# Patient Record
Sex: Male | Born: 1965 | Race: White | Hispanic: No | Marital: Married | Smoking: Never smoker
Health system: Southern US, Community
[De-identification: ages and names within clinical notes are randomized; demographics above are authoritative.]

## PROBLEM LIST (undated history)

## (undated) DIAGNOSIS — T148XXA Other injury of unspecified body region, initial encounter: Secondary | ICD-10-CM

---

## 1990-10-12 DIAGNOSIS — T148XXA Other injury of unspecified body region, initial encounter: Secondary | ICD-10-CM

## 1990-10-12 HISTORY — DX: Other injury of unspecified body region, initial encounter: T14.8XXA

## 2007-02-27 ENCOUNTER — Other Ambulatory Visit: Payer: Self-pay

## 2007-02-27 ENCOUNTER — Emergency Department: Payer: Self-pay | Admitting: Emergency Medicine

## 2014-02-23 ENCOUNTER — Ambulatory Visit: Payer: Self-pay | Admitting: Emergency Medicine

## 2015-12-26 ENCOUNTER — Ambulatory Visit
Admission: EM | Admit: 2015-12-26 | Discharge: 2015-12-26 | Disposition: A | Discharge status: Home / Self Care | Payer: BLUE CROSS/BLUE SHIELD | Attending: Family Medicine | Admitting: Family Medicine

## 2015-12-26 DIAGNOSIS — J069 Acute upper respiratory infection, unspecified: Secondary | ICD-10-CM

## 2015-12-26 DIAGNOSIS — J0181 Other acute recurrent sinusitis: Secondary | ICD-10-CM

## 2015-12-26 HISTORY — DX: Other injury of unspecified body region, initial encounter: T14.8XXA

## 2015-12-26 LAB — RAPID INFLUENZA A&B ANTIGENS (ARMC ONLY): INFLUENZA A (ARMC): NEGATIVE

## 2015-12-26 LAB — RAPID STREP SCREEN (MED CTR MEBANE ONLY): Streptococcus, Group A Screen (Direct): NEGATIVE

## 2015-12-26 LAB — RAPID INFLUENZA A&B ANTIGENS: Influenza B (ARMC): NEGATIVE

## 2015-12-26 MED ORDER — AZITHROMYCIN 250 MG PO TABS: ORAL_TABLET | ORAL | Status: DC

## 2015-12-26 MED ORDER — FEXOFENADINE-PSEUDOEPHED ER 180-240 MG PO TB24: 1.0000 | ORAL_TABLET | Freq: Every day | ORAL | Status: DC

## 2015-12-26 MED ORDER — BENZONATATE 100 MG PO CAPS: 100.0000 mg | ORAL_CAPSULE | Freq: Three times a day (TID) | ORAL | Status: DC

## 2015-12-26 NOTE — Discharge Instructions (Signed)
Sinusitis, Adult °Sinusitis is redness, soreness, and puffiness (inflammation) of the air pockets in the bones of your face (sinuses). The redness, soreness, and puffiness can cause air and mucus to get trapped in your sinuses. This can allow germs to grow and cause an infection.  °HOME CARE  °· Drink enough fluids to keep your pee (urine) clear or pale yellow. °· Use a humidifier in your home. °· Run a hot shower to create steam in the bathroom. Sit in the bathroom with the door closed. Breathe in the steam 3-4 times a day. °· Put a warm, moist washcloth on your face 3-4 times a day, or as told by your doctor. °· Use salt water sprays (saline sprays) to wet the thick fluid in your nose. This can help the sinuses drain. °· Only take medicine as told by your doctor. °GET HELP RIGHT AWAY IF:  °· Your pain gets worse. °· You have very bad headaches. °· You are sick to your stomach (nauseous). °· You throw up (vomit). °· You are very sleepy (drowsy) all the time. °· Your face is puffy (swollen). °· Your vision changes. °· You have a stiff neck. °· You have trouble breathing. °MAKE SURE YOU:  °· Understand these instructions. °· Will watch your condition. °· Will get help right away if you are not doing well or get worse. °  °This information is not intended to replace advice given to you by your health care provider. Make sure you discuss any questions you have with your health care provider. °  °Document Released: 03/16/2008 Document Revised: 10/19/2014 Document Reviewed: 05/03/2012 °Elsevier Interactive Patient Education ©2016 Elsevier Inc. ° °Upper Respiratory Infection, Adult °Most upper respiratory infections (URIs) are caused by a virus. A URI affects the nose, throat, and upper air passages. The most common type of URI is often called "the common cold." °HOME CARE  °· Take medicines only as told by your doctor. °· Gargle warm saltwater or take cough drops to comfort your throat as told by your doctor. °· Use a  warm mist humidifier or inhale steam from a shower to increase air moisture. This may make it easier to breathe. °· Drink enough fluid to keep your pee (urine) clear or pale yellow. °· Eat soups and other clear broths. °· Have a healthy diet. °· Rest as needed. °· Go back to work when your fever is gone or your doctor says it is okay. °¨ You may need to stay home longer to avoid giving your URI to others. °¨ You can also wear a face mask and wash your hands often to prevent spread of the virus. °· Use your inhaler more if you have asthma. °· Do not use any tobacco products, including cigarettes, chewing tobacco, or electronic cigarettes. If you need help quitting, ask your doctor. °GET HELP IF: °· You are getting worse, not better. °· Your symptoms are not helped by medicine. °· You have chills. °· You are getting more short of breath. °· You have brown or red mucus. °· You have yellow or brown discharge from your nose. °· You have pain in your face, especially when you bend forward. °· You have a fever. °· You have puffy (swollen) neck glands. °· You have pain while swallowing. °· You have white areas in the back of your throat. °GET HELP RIGHT AWAY IF:  °· You have very bad or constant: °¨ Headache. °¨ Ear pain. °¨ Pain in your forehead, behind your eyes, and over   your cheekbones (sinus pain). °¨ Chest pain. °· You have long-lasting (chronic) lung disease and any of the following: °¨ Wheezing. °¨ Long-lasting cough. °¨ Coughing up blood. °¨ A change in your usual mucus. °· You have a stiff neck. °· You have changes in your: °¨ Vision. °¨ Hearing. °¨ Thinking. °¨ Mood. °MAKE SURE YOU:  °· Understand these instructions. °· Will watch your condition. °· Will get help right away if you are not doing well or get worse. °  °This information is not intended to replace advice given to you by your health care provider. Make sure you discuss any questions you have with your health care provider. °  °Document Released:  03/16/2008 Document Revised: 02/12/2015 Document Reviewed: 01/03/2014 °Elsevier Interactive Patient Education ©2016 Elsevier Inc. ° °

## 2015-12-26 NOTE — ED Provider Notes (Signed)
CSN: [id number]     Arrival date & time 12/26/15  1003 History   First MD Initiated Contact with Patient 12/26/15 1121    Nurses notes were reviewed. Chief Complaint  Patient presents with  . URI    Patient states that he first got sick Monday. Started getting worse Tuesday Wednesday. He reports this morning feels a lot worse and came in to be seen and evaluated. Does not smoke and no sick family medical problems that he is aware of. He has had fracture of his right hand and denies smoking.The family medical history pertinent to this visit.   (Consider location/radiation/quality/duration/timing/severity/associated sxs/prior Treatment) Patient is a 50 y.o. male presenting with URI. The history is provided by the patient. No language interpreter was used.  URI Presenting symptoms: congestion, cough, fatigue and rhinorrhea   Severity:  Moderate Onset quality:  Sudden Duration:  3 weeks Progression:  Worsening Chronicity:  New Relieved by:  Nothing Worsened by:  Nothing tried Associated symptoms: myalgias, sinus pain and sneezing     Past Medical History  Diagnosis Date  . Broken bones 1992    Right Hand   History reviewed. No pertinent past surgical history. History reviewed. No pertinent family history. Social History  Substance Use Topics  . Smoking status: Never Smoker   . Smokeless tobacco: Never Used  . Alcohol Use: Yes    Review of Systems  Constitutional: Positive for fatigue.  HENT: Positive for congestion, rhinorrhea and sneezing.   Respiratory: Positive for cough.   Musculoskeletal: Positive for myalgias.  All other systems reviewed and are negative.   Allergies  Review of patient's allergies indicates no known allergies.  Home Medications   Prior to Admission medications   Medication Sig Start Date End Date Taking? Authorizing Provider  clotrimazole (LOTRIMIN) 1 % cream Apply 1 application topically 2 (two) times daily.   Yes Historical Provider, MD   Phenyleph-CPM-DM-APAP (ALKA-SELTZER PLUS COLD & FLU PO) Take by mouth.   Yes Historical Provider, MD  TERBINAFINE HCL PO Take 250 mg by mouth daily.   Yes Historical Provider, MD  azithromycin (ZITHROMAX Z-PAK) 250 MG tablet Take 2 tablets first day and then 1 po a day for 4 days 12/26/15   Hassan Rowan, MD  benzonatate (TESSALON) 100 MG capsule Take 1 capsule (100 mg total) by mouth every 8 (eight) hours. 12/26/15   Hassan Rowan, MD  fexofenadine-pseudoephedrine (ALLEGRA-D ALLERGY & CONGESTION) 180-240 MG 24 hr tablet Take 1 tablet by mouth daily. 12/26/15   Hassan Rowan, MD   Meds Ordered and Administered this Visit  Medications - No data to display  BP 142/91 mmHg  Pulse 74  Temp(Src) 98 F (36.7 C) (Oral)  Resp 16  Ht 6' (1.829 m)  Wt 230 lb (104.327 kg)  BMI 31.19 kg/m2  SpO2 99% No data found.   Physical Exam  Constitutional: He appears well-developed and well-nourished.  HENT:  Head: Normocephalic and atraumatic.  Eyes: Conjunctivae are normal. Pupils are equal, round, and reactive to light.  Neck: Normal range of motion. Neck supple.  Cardiovascular: Normal rate, regular rhythm and normal heart sounds.   Pulmonary/Chest: Effort normal and breath sounds normal.  Musculoskeletal: Normal range of motion.  Neurological: He is alert.  Skin: Skin is warm and dry.  Vitals reviewed.   ED Course  Procedures (including critical care time)  Labs Review Labs Reviewed  RAPID INFLUENZA A&B ANTIGENS ([facility] ONLY)  RAPID STREP SCREEN (NOT AT [facility])  CULTURE, GROUP A STREP [facility])  Imaging Review No results found.   Visual Acuity Review  Right Eye Distance:   Left Eye Distance:   Bilateral Distance:    Right Eye Near:   Left Eye Near:    Bilateral Near:     Results for orders placed or performed during the hospital encounter of 12/26/15  Rapid Influenza A&B Antigens ([facility] only)  Result Value Ref Range   Influenza A ([facility]) NEGATIVE NEGATIVE   Influenza B ([facility]) NEGATIVE  NEGATIVE  Rapid strep screen  Result Value Ref Range   Streptococcus, Group A Screen ([facility]) NEGATIVE NEGATIVE      MDM   1. URI, acute   2. Other acute recurrent sinusitis    Explained that this really looks more viral than anything else. We'll place him on Tessalon Perles and Allegra-D. Initially was would give him a postdated prescription for Zithromax they could fill on Sunday since that we'll give him at least 6-7 days of having symptoms before treat with antibiotic. He has informed me that he is going after [employer] tonight for training and will be gone Thursday night Friday and Saturday and Sunday. Her concern is that since his OB in the room with a roommate and his echo [facility] I'll go ahead and place him on Zithromax for him to take and work note for today.   Note: This dictation was prepared with Dragon dictation along with smaller phrase technology. Any transcriptional errors that result from this process are unintentional.  Hassan RowanEugene Craig Ionescu, MD 12/26/15 1254

## 2015-12-26 NOTE — ED Notes (Signed)
Patient c/o swollen glands, headache, nasal congestion/pressure, sore throat, and slight body aches which all started 2 days ago.  Denies fever/c/n/v or chest pain.

## 2015-12-28 LAB — CULTURE, GROUP A STREP (THRC)

## 2017-01-12 DIAGNOSIS — M9903 Segmental and somatic dysfunction of lumbar region: Secondary | ICD-10-CM | POA: Diagnosis not present

## 2017-01-12 DIAGNOSIS — M5386 Other specified dorsopathies, lumbar region: Secondary | ICD-10-CM | POA: Diagnosis not present

## 2017-01-25 DIAGNOSIS — M9903 Segmental and somatic dysfunction of lumbar region: Secondary | ICD-10-CM | POA: Diagnosis not present

## 2017-01-25 DIAGNOSIS — M5386 Other specified dorsopathies, lumbar region: Secondary | ICD-10-CM | POA: Diagnosis not present

## 2017-02-24 DIAGNOSIS — B9689 Other specified bacterial agents as the cause of diseases classified elsewhere: Secondary | ICD-10-CM | POA: Diagnosis not present

## 2017-02-24 DIAGNOSIS — M5116 Intervertebral disc disorders with radiculopathy, lumbar region: Secondary | ICD-10-CM | POA: Diagnosis not present

## 2017-02-24 DIAGNOSIS — J019 Acute sinusitis, unspecified: Secondary | ICD-10-CM | POA: Diagnosis not present

## 2017-02-24 DIAGNOSIS — M5417 Radiculopathy, lumbosacral region: Secondary | ICD-10-CM | POA: Diagnosis not present

## 2017-03-02 DIAGNOSIS — M5137 Other intervertebral disc degeneration, lumbosacral region: Secondary | ICD-10-CM | POA: Diagnosis not present

## 2017-03-02 DIAGNOSIS — M545 Low back pain: Secondary | ICD-10-CM | POA: Diagnosis not present

## 2017-03-02 DIAGNOSIS — M5432 Sciatica, left side: Secondary | ICD-10-CM | POA: Diagnosis not present

## 2017-03-02 DIAGNOSIS — M9903 Segmental and somatic dysfunction of lumbar region: Secondary | ICD-10-CM | POA: Diagnosis not present

## 2017-03-03 DIAGNOSIS — M9903 Segmental and somatic dysfunction of lumbar region: Secondary | ICD-10-CM | POA: Diagnosis not present

## 2017-03-03 DIAGNOSIS — M5137 Other intervertebral disc degeneration, lumbosacral region: Secondary | ICD-10-CM | POA: Diagnosis not present

## 2017-03-03 DIAGNOSIS — M545 Low back pain: Secondary | ICD-10-CM | POA: Diagnosis not present

## 2017-03-03 DIAGNOSIS — M5432 Sciatica, left side: Secondary | ICD-10-CM | POA: Diagnosis not present

## 2017-03-04 DIAGNOSIS — M9903 Segmental and somatic dysfunction of lumbar region: Secondary | ICD-10-CM | POA: Diagnosis not present

## 2017-03-04 DIAGNOSIS — M545 Low back pain: Secondary | ICD-10-CM | POA: Diagnosis not present

## 2017-03-04 DIAGNOSIS — M5137 Other intervertebral disc degeneration, lumbosacral region: Secondary | ICD-10-CM | POA: Diagnosis not present

## 2017-03-04 DIAGNOSIS — M5432 Sciatica, left side: Secondary | ICD-10-CM | POA: Diagnosis not present

## 2017-03-09 DIAGNOSIS — M5137 Other intervertebral disc degeneration, lumbosacral region: Secondary | ICD-10-CM | POA: Diagnosis not present

## 2017-03-09 DIAGNOSIS — M545 Low back pain: Secondary | ICD-10-CM | POA: Diagnosis not present

## 2017-03-09 DIAGNOSIS — M5432 Sciatica, left side: Secondary | ICD-10-CM | POA: Diagnosis not present

## 2017-03-09 DIAGNOSIS — M9903 Segmental and somatic dysfunction of lumbar region: Secondary | ICD-10-CM | POA: Diagnosis not present

## 2017-03-10 DIAGNOSIS — M9903 Segmental and somatic dysfunction of lumbar region: Secondary | ICD-10-CM | POA: Diagnosis not present

## 2017-03-10 DIAGNOSIS — M5432 Sciatica, left side: Secondary | ICD-10-CM | POA: Diagnosis not present

## 2017-03-10 DIAGNOSIS — M545 Low back pain: Secondary | ICD-10-CM | POA: Diagnosis not present

## 2017-03-10 DIAGNOSIS — M5137 Other intervertebral disc degeneration, lumbosacral region: Secondary | ICD-10-CM | POA: Diagnosis not present

## 2017-03-12 DIAGNOSIS — M9903 Segmental and somatic dysfunction of lumbar region: Secondary | ICD-10-CM | POA: Diagnosis not present

## 2017-03-12 DIAGNOSIS — M545 Low back pain: Secondary | ICD-10-CM | POA: Diagnosis not present

## 2017-03-12 DIAGNOSIS — M5432 Sciatica, left side: Secondary | ICD-10-CM | POA: Diagnosis not present

## 2017-03-12 DIAGNOSIS — M5137 Other intervertebral disc degeneration, lumbosacral region: Secondary | ICD-10-CM | POA: Diagnosis not present

## 2017-03-15 DIAGNOSIS — M5432 Sciatica, left side: Secondary | ICD-10-CM | POA: Diagnosis not present

## 2017-03-15 DIAGNOSIS — M545 Low back pain: Secondary | ICD-10-CM | POA: Diagnosis not present

## 2017-03-15 DIAGNOSIS — M5137 Other intervertebral disc degeneration, lumbosacral region: Secondary | ICD-10-CM | POA: Diagnosis not present

## 2017-03-15 DIAGNOSIS — M9903 Segmental and somatic dysfunction of lumbar region: Secondary | ICD-10-CM | POA: Diagnosis not present

## 2017-03-17 DIAGNOSIS — M5137 Other intervertebral disc degeneration, lumbosacral region: Secondary | ICD-10-CM | POA: Diagnosis not present

## 2017-03-17 DIAGNOSIS — M9903 Segmental and somatic dysfunction of lumbar region: Secondary | ICD-10-CM | POA: Diagnosis not present

## 2017-03-17 DIAGNOSIS — M545 Low back pain: Secondary | ICD-10-CM | POA: Diagnosis not present

## 2017-03-17 DIAGNOSIS — M5432 Sciatica, left side: Secondary | ICD-10-CM | POA: Diagnosis not present

## 2017-03-19 DIAGNOSIS — M545 Low back pain: Secondary | ICD-10-CM | POA: Diagnosis not present

## 2017-03-19 DIAGNOSIS — M5137 Other intervertebral disc degeneration, lumbosacral region: Secondary | ICD-10-CM | POA: Diagnosis not present

## 2017-03-19 DIAGNOSIS — M9903 Segmental and somatic dysfunction of lumbar region: Secondary | ICD-10-CM | POA: Diagnosis not present

## 2017-03-19 DIAGNOSIS — M5432 Sciatica, left side: Secondary | ICD-10-CM | POA: Diagnosis not present

## 2017-03-22 DIAGNOSIS — M5432 Sciatica, left side: Secondary | ICD-10-CM | POA: Diagnosis not present

## 2017-03-22 DIAGNOSIS — M5137 Other intervertebral disc degeneration, lumbosacral region: Secondary | ICD-10-CM | POA: Diagnosis not present

## 2017-03-22 DIAGNOSIS — M545 Low back pain: Secondary | ICD-10-CM | POA: Diagnosis not present

## 2017-03-22 DIAGNOSIS — M9903 Segmental and somatic dysfunction of lumbar region: Secondary | ICD-10-CM | POA: Diagnosis not present

## 2017-03-24 DIAGNOSIS — M5137 Other intervertebral disc degeneration, lumbosacral region: Secondary | ICD-10-CM | POA: Diagnosis not present

## 2017-03-24 DIAGNOSIS — M9903 Segmental and somatic dysfunction of lumbar region: Secondary | ICD-10-CM | POA: Diagnosis not present

## 2017-03-24 DIAGNOSIS — M5432 Sciatica, left side: Secondary | ICD-10-CM | POA: Diagnosis not present

## 2017-03-24 DIAGNOSIS — M545 Low back pain: Secondary | ICD-10-CM | POA: Diagnosis not present

## 2017-03-26 DIAGNOSIS — M5137 Other intervertebral disc degeneration, lumbosacral region: Secondary | ICD-10-CM | POA: Diagnosis not present

## 2017-03-26 DIAGNOSIS — M5432 Sciatica, left side: Secondary | ICD-10-CM | POA: Diagnosis not present

## 2017-03-26 DIAGNOSIS — M545 Low back pain: Secondary | ICD-10-CM | POA: Diagnosis not present

## 2017-03-26 DIAGNOSIS — M9903 Segmental and somatic dysfunction of lumbar region: Secondary | ICD-10-CM | POA: Diagnosis not present

## 2017-03-29 DIAGNOSIS — M9903 Segmental and somatic dysfunction of lumbar region: Secondary | ICD-10-CM | POA: Diagnosis not present

## 2017-03-29 DIAGNOSIS — M5137 Other intervertebral disc degeneration, lumbosacral region: Secondary | ICD-10-CM | POA: Diagnosis not present

## 2017-03-29 DIAGNOSIS — M545 Low back pain: Secondary | ICD-10-CM | POA: Diagnosis not present

## 2017-03-29 DIAGNOSIS — M5432 Sciatica, left side: Secondary | ICD-10-CM | POA: Diagnosis not present

## 2017-03-31 DIAGNOSIS — M5432 Sciatica, left side: Secondary | ICD-10-CM | POA: Diagnosis not present

## 2017-03-31 DIAGNOSIS — M9903 Segmental and somatic dysfunction of lumbar region: Secondary | ICD-10-CM | POA: Diagnosis not present

## 2017-03-31 DIAGNOSIS — M545 Low back pain: Secondary | ICD-10-CM | POA: Diagnosis not present

## 2017-03-31 DIAGNOSIS — M5137 Other intervertebral disc degeneration, lumbosacral region: Secondary | ICD-10-CM | POA: Diagnosis not present

## 2017-04-05 DIAGNOSIS — M545 Low back pain: Secondary | ICD-10-CM | POA: Diagnosis not present

## 2017-04-05 DIAGNOSIS — M5137 Other intervertebral disc degeneration, lumbosacral region: Secondary | ICD-10-CM | POA: Diagnosis not present

## 2017-04-05 DIAGNOSIS — M5432 Sciatica, left side: Secondary | ICD-10-CM | POA: Diagnosis not present

## 2017-04-05 DIAGNOSIS — M9903 Segmental and somatic dysfunction of lumbar region: Secondary | ICD-10-CM | POA: Diagnosis not present

## 2017-04-07 DIAGNOSIS — M5137 Other intervertebral disc degeneration, lumbosacral region: Secondary | ICD-10-CM | POA: Diagnosis not present

## 2017-04-07 DIAGNOSIS — M9903 Segmental and somatic dysfunction of lumbar region: Secondary | ICD-10-CM | POA: Diagnosis not present

## 2017-04-07 DIAGNOSIS — M5432 Sciatica, left side: Secondary | ICD-10-CM | POA: Diagnosis not present

## 2017-04-07 DIAGNOSIS — M545 Low back pain: Secondary | ICD-10-CM | POA: Diagnosis not present

## 2017-04-12 DIAGNOSIS — M9903 Segmental and somatic dysfunction of lumbar region: Secondary | ICD-10-CM | POA: Diagnosis not present

## 2017-04-12 DIAGNOSIS — M5432 Sciatica, left side: Secondary | ICD-10-CM | POA: Diagnosis not present

## 2017-04-12 DIAGNOSIS — M545 Low back pain: Secondary | ICD-10-CM | POA: Diagnosis not present

## 2017-04-12 DIAGNOSIS — M5137 Other intervertebral disc degeneration, lumbosacral region: Secondary | ICD-10-CM | POA: Diagnosis not present

## 2017-06-02 DIAGNOSIS — M48062 Spinal stenosis, lumbar region with neurogenic claudication: Secondary | ICD-10-CM | POA: Diagnosis not present

## 2017-06-29 ENCOUNTER — Other Ambulatory Visit: Payer: Self-pay | Admitting: Neurosurgery

## 2017-06-29 DIAGNOSIS — M48062 Spinal stenosis, lumbar region with neurogenic claudication: Secondary | ICD-10-CM

## 2017-07-10 ENCOUNTER — Ambulatory Visit
Admission: RE | Admit: 2017-07-10 | Discharge: 2017-07-10 | Disposition: A | Discharge status: Home / Self Care | Payer: BLUE CROSS/BLUE SHIELD | Source: Ambulatory Visit | Attending: Neurosurgery | Admitting: Neurosurgery

## 2017-07-10 DIAGNOSIS — M48061 Spinal stenosis, lumbar region without neurogenic claudication: Secondary | ICD-10-CM | POA: Diagnosis not present

## 2017-07-10 DIAGNOSIS — M48062 Spinal stenosis, lumbar region with neurogenic claudication: Secondary | ICD-10-CM

## 2017-07-15 DIAGNOSIS — R030 Elevated blood-pressure reading, without diagnosis of hypertension: Secondary | ICD-10-CM | POA: Diagnosis not present

## 2017-07-15 DIAGNOSIS — M48062 Spinal stenosis, lumbar region with neurogenic claudication: Secondary | ICD-10-CM | POA: Diagnosis not present

## 2017-07-15 DIAGNOSIS — Z6832 Body mass index (BMI) 32.0-32.9, adult: Secondary | ICD-10-CM | POA: Diagnosis not present

## 2017-07-30 DIAGNOSIS — R030 Elevated blood-pressure reading, without diagnosis of hypertension: Secondary | ICD-10-CM | POA: Diagnosis not present

## 2017-07-30 DIAGNOSIS — M48062 Spinal stenosis, lumbar region with neurogenic claudication: Secondary | ICD-10-CM | POA: Diagnosis not present

## 2017-07-30 DIAGNOSIS — M5416 Radiculopathy, lumbar region: Secondary | ICD-10-CM | POA: Diagnosis not present

## 2017-07-30 DIAGNOSIS — Z6832 Body mass index (BMI) 32.0-32.9, adult: Secondary | ICD-10-CM | POA: Diagnosis not present

## 2017-08-02 DIAGNOSIS — R079 Chest pain, unspecified: Secondary | ICD-10-CM | POA: Diagnosis not present

## 2017-08-06 DIAGNOSIS — R030 Elevated blood-pressure reading, without diagnosis of hypertension: Secondary | ICD-10-CM | POA: Diagnosis not present

## 2017-08-06 DIAGNOSIS — M5416 Radiculopathy, lumbar region: Secondary | ICD-10-CM | POA: Diagnosis not present

## 2017-08-06 DIAGNOSIS — Z6832 Body mass index (BMI) 32.0-32.9, adult: Secondary | ICD-10-CM | POA: Diagnosis not present

## 2017-08-06 DIAGNOSIS — M48062 Spinal stenosis, lumbar region with neurogenic claudication: Secondary | ICD-10-CM | POA: Diagnosis not present

## 2017-08-09 DIAGNOSIS — K219 Gastro-esophageal reflux disease without esophagitis: Secondary | ICD-10-CM | POA: Diagnosis not present

## 2017-08-09 DIAGNOSIS — R0789 Other chest pain: Secondary | ICD-10-CM | POA: Diagnosis not present

## 2017-08-18 DIAGNOSIS — R0789 Other chest pain: Secondary | ICD-10-CM | POA: Diagnosis not present

## 2017-08-25 DIAGNOSIS — Z5181 Encounter for therapeutic drug level monitoring: Secondary | ICD-10-CM | POA: Diagnosis not present

## 2017-08-25 DIAGNOSIS — K219 Gastro-esophageal reflux disease without esophagitis: Secondary | ICD-10-CM | POA: Diagnosis not present

## 2017-08-25 DIAGNOSIS — R0789 Other chest pain: Secondary | ICD-10-CM | POA: Diagnosis not present

## 2017-10-13 DIAGNOSIS — M5416 Radiculopathy, lumbar region: Secondary | ICD-10-CM | POA: Diagnosis not present

## 2017-10-13 DIAGNOSIS — R030 Elevated blood-pressure reading, without diagnosis of hypertension: Secondary | ICD-10-CM | POA: Diagnosis not present

## 2017-10-13 DIAGNOSIS — M48062 Spinal stenosis, lumbar region with neurogenic claudication: Secondary | ICD-10-CM | POA: Diagnosis not present

## 2017-10-13 DIAGNOSIS — Z6831 Body mass index (BMI) 31.0-31.9, adult: Secondary | ICD-10-CM | POA: Diagnosis not present

## 2017-12-06 IMAGING — MR MR LUMBAR SPINE W/O CM
4 of 5 series · 25 of 48 positions shown · non-contrast
Comparison: None.

CLINICAL DATA: Low back pain with bilateral leg pain and hip pain
for 6 months. One episode of numbness. No acute injury or prior
relevant surgery.

EXAM:
MRI LUMBAR SPINE WITHOUT CONTRAST
TECHNIQUE: Multiplanar, multisequence MR imaging of the lumbar spine was
performed. No intravenous contrast was administered.

[Series 3: T2 · sagittal · 4.0mm · 0.55mm/px · 6 of 15 slices shown (1 of 2)]
[im 1/15]
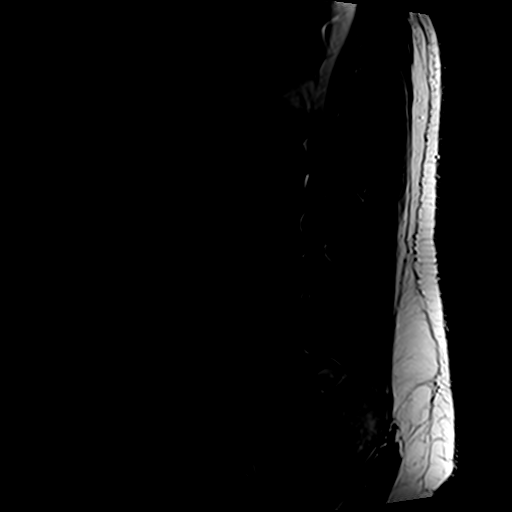
[im 3/15]
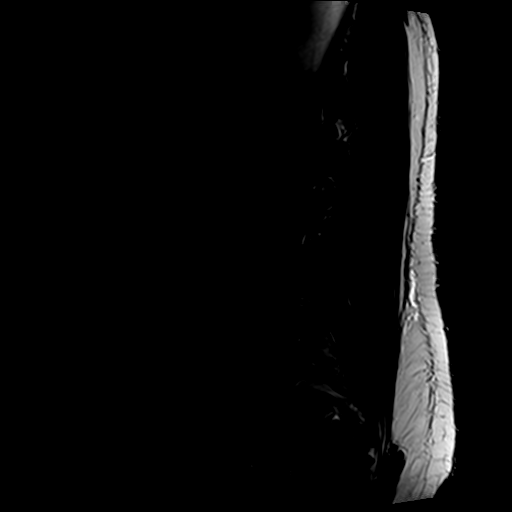
[im 6/15]
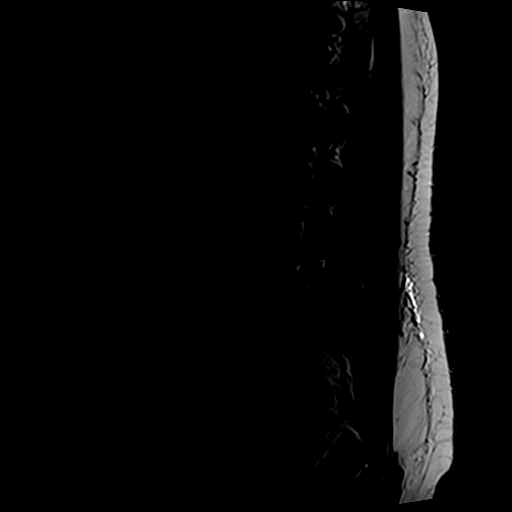
[im 9/15]
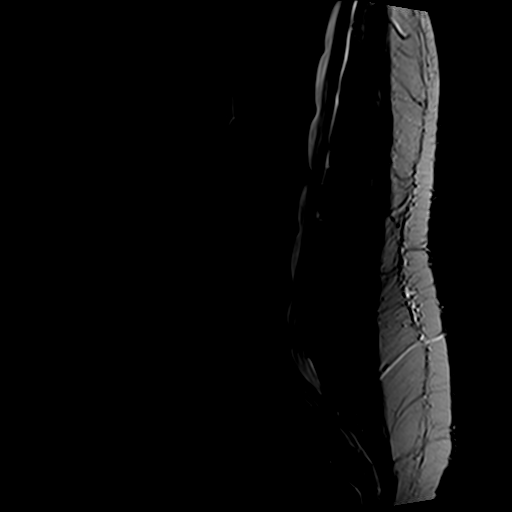
[im 12/15]
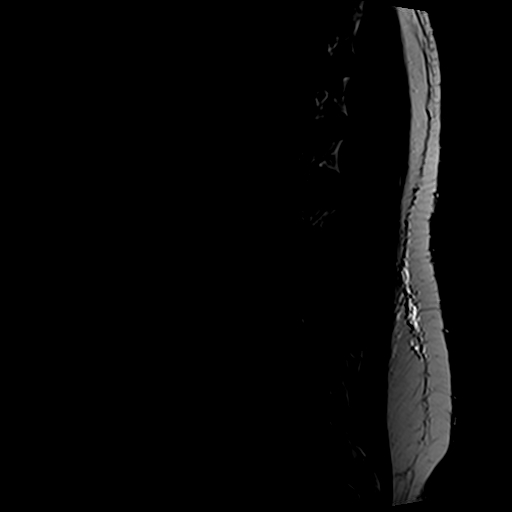
[im 15/15]
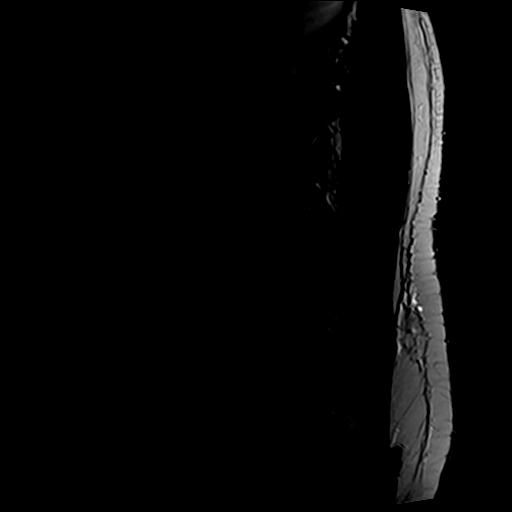

[Series 4: T1 · sagittal · 4.0mm · 0.55mm/px · 6 of 15 slices shown (1 of 2)]
[im 1/15]
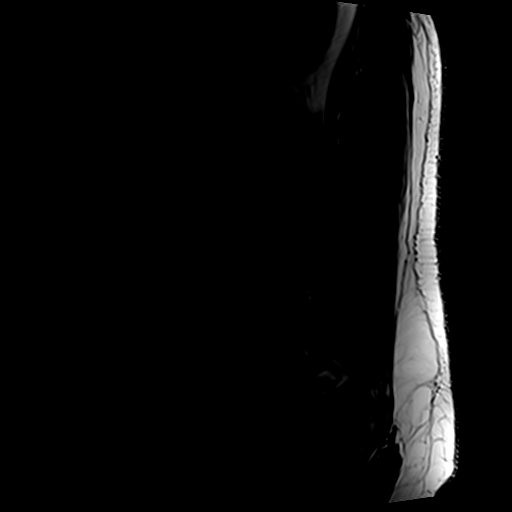
[im 3/15]
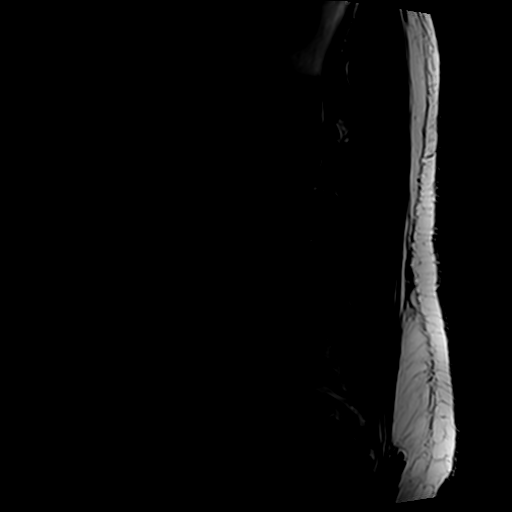
[im 6/15]
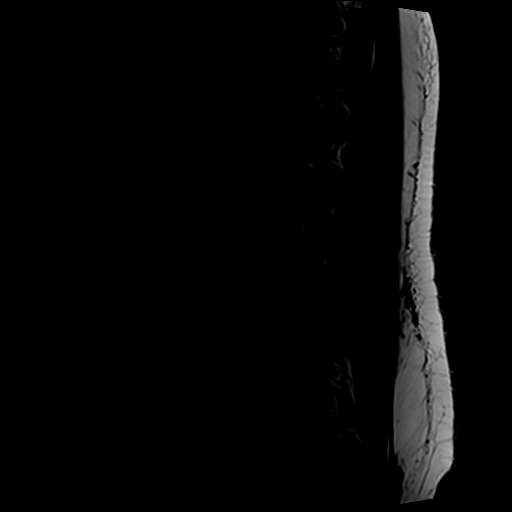
[im 9/15]
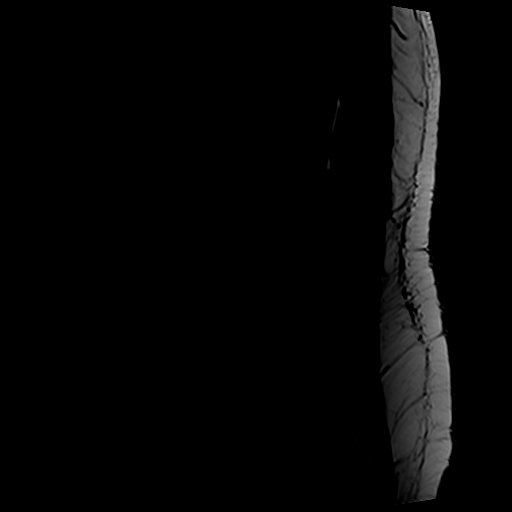
[im 12/15]
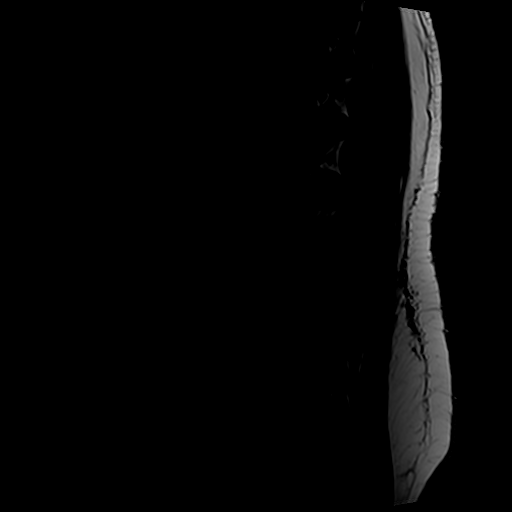
[im 15/15]
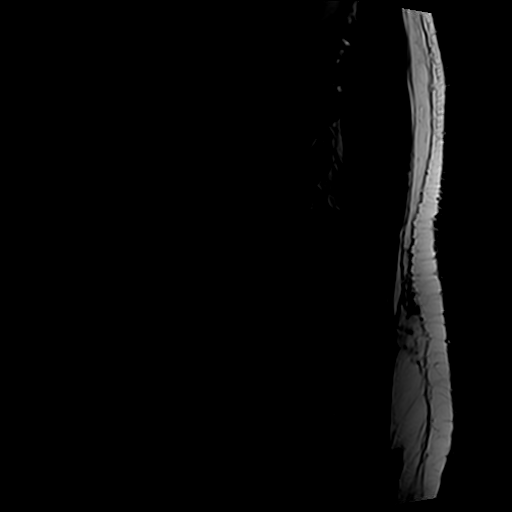

[Series 6: T2 · axial · 4.0mm · 0.70mm/px · z∈[-119,+94]mm · 9 of 40 slices shown (2 of 2)]
[im 1/40]
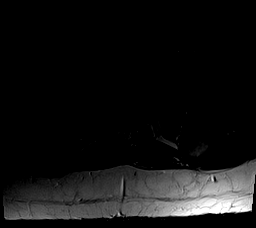
[im 6/40]
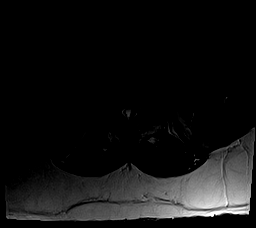
[im 12/40]
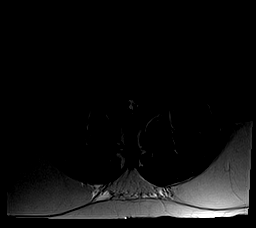
[im 17/40]
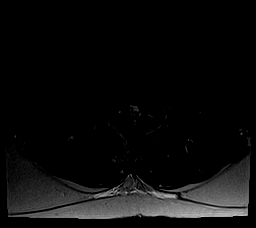
[im 20/40]
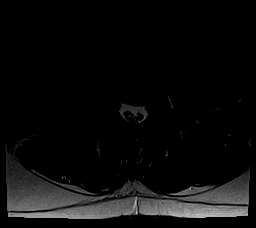
[im 23/40]
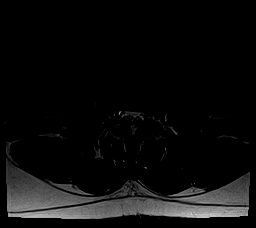
[im 28/40]
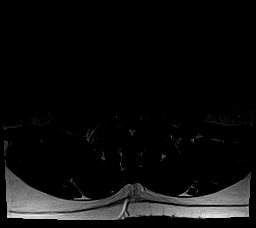
[im 34/40]
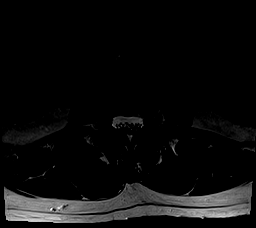
[im 40/40]
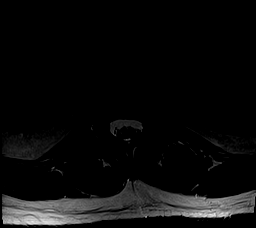

[Series 7: T1 · axial · 4.0mm · 0.35mm/px · z∈[-119,+64]mm · 4 of 40 slices shown (2 of 2)]
[im 1/40]
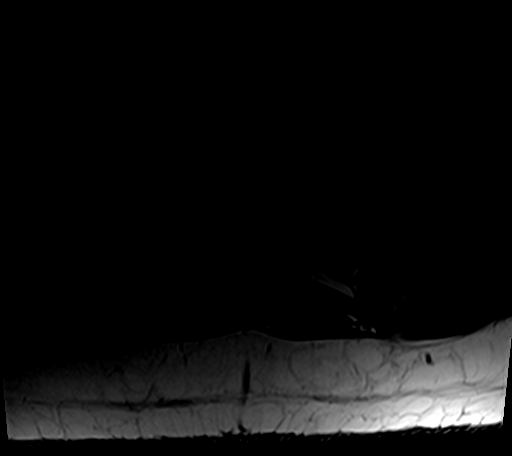
[im 6/40]
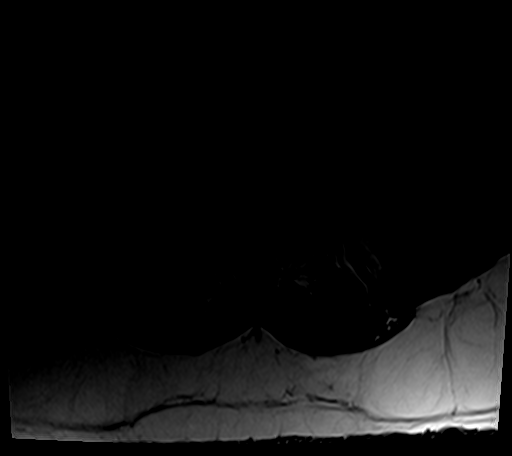
[im 20/40]
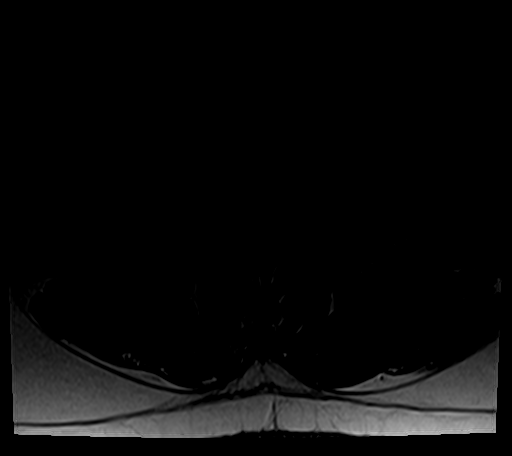
[im 34/40]
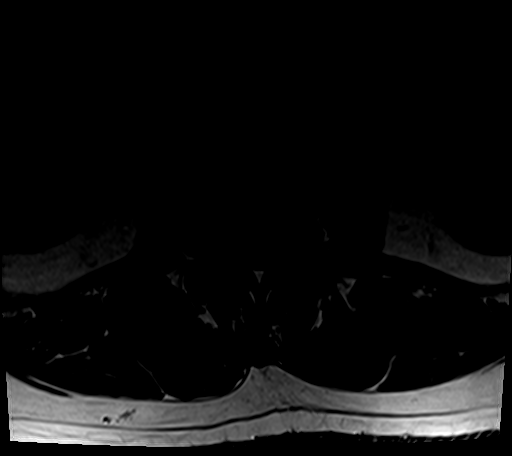

[25 of 48 positions shown; findings below may reference images not displayed]

FINDINGS: Segmentation: Conventional anatomy assumed, with the last open disc
space designated L5-S1.

Alignment:  Straightening without focal angulation or listhesis.

Vertebrae: No worrisome osseous lesion, acute fracture or pars
defect. There are scattered endplate degenerative changes, greatest
at L3-4. The lumbar pedicles are somewhat short on a congenital
basis. The visualized sacroiliac joints appear unremarkable.

Conus medullaris: Extends to the T12-L1 level and appears normal.

Paraspinal and other soft tissues: No significant paraspinal
findings.

Disc levels:

T12-L1: Mild disc bulging and shallow disc protrusion in the left
subarticular zone. No spinal stenosis or nerve root encroachment.

L1-2: Mild disc bulging with small central disc extrusion
demonstrating caudal down turning. There is mild mass effect on the
thecal sac and mild narrowing of the lateral recesses. The foramina
appear sufficiently patent.

L2-3: Loss of disc height with mild disc bulging. No significant
spinal stenosis or nerve root encroachment.

L3-4: Loss of disc height with annular disc bulging and endplate
osteophytes asymmetric to the right. Mild facet and ligamentous
hypertrophy. These factors contribute to mild spinal stenosis and
mild narrowing of the lateral recesses. There is probable
extraforaminal encroachment on the right L3 nerve root, best seen on
axial image 24.

L4-5: Mild loss of disc height with annular disc bulging and
endplate osteophytes asymmetric to the left. There is mild facet and
ligamentous hypertrophy. These factors contribute to
mild-to-moderate spinal stenosis with asymmetric narrowing of the
left lateral recess and left foramen. Probable left L5 nerve root
encroachment.

L5-S1: Shallow right paracentral disc protrusion touches the right
S1 nerve root in the canal. Both foramina are patent.
IMPRESSION: 1. Disc bulging and a small central disc extrusion at L1-2
contribute to mild spinal stenosis and mild narrowing of the lateral
recesses.
2. Mild multifactorial spinal stenosis at L3-4 with probable
extraforaminal encroachment on the right L3 nerve root by disc
material and osteophytes.
3. Mild to moderate multifactorial spinal stenosis at L4-5 with
asymmetric narrowing of the left lateral recess and left foramen.
Probable left L5 nerve root encroachment.
4. Shallow right paracentral disc protrusion at L5-S1 with possible
right S1 nerve root encroachment in the canal.

## 2017-12-29 DIAGNOSIS — R509 Fever, unspecified: Secondary | ICD-10-CM | POA: Diagnosis not present

## 2017-12-29 DIAGNOSIS — J069 Acute upper respiratory infection, unspecified: Secondary | ICD-10-CM | POA: Diagnosis not present

## 2017-12-29 DIAGNOSIS — J029 Acute pharyngitis, unspecified: Secondary | ICD-10-CM | POA: Diagnosis not present

## 2018-03-01 DIAGNOSIS — Z5181 Encounter for therapeutic drug level monitoring: Secondary | ICD-10-CM | POA: Diagnosis not present

## 2018-03-01 DIAGNOSIS — Z8639 Personal history of other endocrine, nutritional and metabolic disease: Secondary | ICD-10-CM | POA: Diagnosis not present

## 2018-03-01 DIAGNOSIS — L570 Actinic keratosis: Secondary | ICD-10-CM | POA: Diagnosis not present

## 2018-03-01 DIAGNOSIS — D2272 Melanocytic nevi of left lower limb, including hip: Secondary | ICD-10-CM | POA: Diagnosis not present

## 2018-03-01 DIAGNOSIS — X32XXXA Exposure to sunlight, initial encounter: Secondary | ICD-10-CM | POA: Diagnosis not present

## 2018-03-01 DIAGNOSIS — D2262 Melanocytic nevi of left upper limb, including shoulder: Secondary | ICD-10-CM | POA: Diagnosis not present

## 2018-03-01 DIAGNOSIS — D2261 Melanocytic nevi of right upper limb, including shoulder: Secondary | ICD-10-CM | POA: Diagnosis not present

## 2018-03-01 DIAGNOSIS — D225 Melanocytic nevi of trunk: Secondary | ICD-10-CM | POA: Diagnosis not present

## 2018-03-01 DIAGNOSIS — R739 Hyperglycemia, unspecified: Secondary | ICD-10-CM | POA: Diagnosis not present

## 2018-03-03 DIAGNOSIS — Z8639 Personal history of other endocrine, nutritional and metabolic disease: Secondary | ICD-10-CM | POA: Diagnosis not present

## 2018-03-03 DIAGNOSIS — Z0001 Encounter for general adult medical examination with abnormal findings: Secondary | ICD-10-CM | POA: Diagnosis not present

## 2018-03-03 DIAGNOSIS — Z1211 Encounter for screening for malignant neoplasm of colon: Secondary | ICD-10-CM | POA: Diagnosis not present

## 2018-03-03 DIAGNOSIS — K219 Gastro-esophageal reflux disease without esophagitis: Secondary | ICD-10-CM | POA: Diagnosis not present

## 2018-03-18 DIAGNOSIS — M25562 Pain in left knee: Secondary | ICD-10-CM | POA: Diagnosis not present

## 2018-03-22 DIAGNOSIS — M25562 Pain in left knee: Secondary | ICD-10-CM | POA: Diagnosis not present

## 2018-04-05 DIAGNOSIS — M25562 Pain in left knee: Secondary | ICD-10-CM | POA: Diagnosis not present

## 2018-04-12 DIAGNOSIS — Z01818 Encounter for other preprocedural examination: Secondary | ICD-10-CM | POA: Diagnosis not present

## 2018-04-12 DIAGNOSIS — Z1211 Encounter for screening for malignant neoplasm of colon: Secondary | ICD-10-CM | POA: Diagnosis not present

## 2018-04-12 DIAGNOSIS — M25562 Pain in left knee: Secondary | ICD-10-CM | POA: Diagnosis not present

## 2018-05-20 DIAGNOSIS — K640 First degree hemorrhoids: Secondary | ICD-10-CM | POA: Diagnosis not present

## 2018-05-20 DIAGNOSIS — Z1211 Encounter for screening for malignant neoplasm of colon: Secondary | ICD-10-CM | POA: Diagnosis not present

## 2018-05-20 DIAGNOSIS — K648 Other hemorrhoids: Secondary | ICD-10-CM | POA: Diagnosis not present

## 2018-10-14 DIAGNOSIS — R0609 Other forms of dyspnea: Secondary | ICD-10-CM | POA: Diagnosis not present

## 2018-11-15 ENCOUNTER — Encounter: Payer: Self-pay | Admitting: Cardiovascular Disease

## 2018-11-15 ENCOUNTER — Ambulatory Visit (INDEPENDENT_AMBULATORY_CARE_PROVIDER_SITE_OTHER): Payer: BLUE CROSS/BLUE SHIELD | Admitting: Cardiovascular Disease

## 2018-11-15 ENCOUNTER — Encounter (INDEPENDENT_AMBULATORY_CARE_PROVIDER_SITE_OTHER): Payer: Self-pay

## 2018-11-15 DIAGNOSIS — R0609 Other forms of dyspnea: Secondary | ICD-10-CM | POA: Insufficient documentation

## 2018-11-15 NOTE — Progress Notes (Signed)
11/15/2018 Gary Allen   [birth year]  [id number]  Primary Physician Gary Nurse, MD Primary Cardiologist: Gary Gess MD Gary Allen  HPI:  Gary Allen is a 53 y.o. 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 Aud[facility] for cardiovascular dilation because of dyspnea.  He has no cardiac risk factors.  Is never had a heart attack or stroke.  He denies chest pain.  He began having dyspnea on exertion 2 months ago and has had 2-3 episodes since.   Current Meds  Medication Sig  . APPLE CIDER VINEGAR PO Take 1,000 mg by mouth.  . [DISCONTINUED] Ascorbic Acid (VITAMIN C) 1000 MG tablet Take 1,000 mg by mouth daily.  . [DISCONTINUED] loratadine (CLARITIN) 10 MG tablet Take 10 mg by mouth daily.     No Known Allergies  Social History   Socioeconomic History  . Marital status: Single    Spouse name: Not on file  . Number of children: Not on file  . Years of education: Not on file  . Highest education level: Not on file  Occupational History  . Not on file  Social Needs  . Financial resource strain: Not on file  . Food insecurity:    Worry: Not on file    Inability: Not on file  . Transportation needs:    Medical: Not on file    Non-medical: Not on file  Tobacco Use  . Smoking status: Never Smoker  . Smokeless tobacco: Never Used  Substance and Sexual Activity  . Alcohol use: Yes  . Drug use: Not on file  . Sexual activity: Not on file  Lifestyle  . Physical activity:    Days per week: Not on file    Minutes per session: Not on file  . Stress: Not on file  Relationships  . Social connections:    Talks on phone: Not on file    Gets together: Not on file    Attends religious service: Not on file    Active member of club or organization: Not on file    Attends meetings of clubs or organizations: Not on file    Relationship status: Not on file  . Intimate partner  violence:    Fear of current or ex partner: Not on file    Emotionally abused: Not on file    Physically abused: Not on file    Forced sexual activity: Not on file  Other Topics Concern  . Not on file  Social History Narrative  . Not on file     Review of Systems: General: negative for chills, fever, night sweats or weight changes.  Cardiovascular: negative for chest pain, dyspnea on exertion, edema, orthopnea, palpitations, paroxysmal nocturnal dyspnea or shortness of breath Dermatological: negative for rash Respiratory: negative for cough or wheezing Urologic: negative for hematuria Abdominal: negative for nausea, vomiting, diarrhea, bright red blood per rectum, melena, or hematemesis Neurologic: negative for visual changes, syncope, or dizziness All other systems reviewed and are otherwise negative except as noted above.    Blood pressure 129/78, pulse 62, height 5\' 11"  (1.803 m), weight 241 lb (109.3 kg).  General appearance: alert and no distress Neck: no adenopathy, no carotid bruit, no JVD, supple, symmetrical, trachea midline and thyroid not enlarged, symmetric, no tenderness/mass/nodules Lungs: clear to auscultation bilaterally Heart: regular rate and rhythm, S1, S2 normal, no murmur, click, rub or gallop Extremities: extremities normal,  atraumatic, no cyanosis or edema Pulses: 2+ and symmetric Skin: Skin color, texture, turgor normal. No rashes or lesions Neurologic: Alert and oriented X 3, normal strength and tone. Normal symmetric reflexes. Normal coordination and gait  EKG not performed today  ASSESSMENT AND PLAN:   Dyspnea on exertion Mr. Gary Allen was referred by his PCP, Dr. Letitia Allen, for evaluation of several episodes of dyspnea on exertion beginning 2 months ago.  He has no cardiac risk factors.  He has never smoked.  He is unaware of his family history since he was adopted.  There is no history of diabetes, hypertension or hyperlipidemia.  He denies chest pain.   Beginning 2 months ago he had several episodes of dyspnea on exertion for unclear reasons.  I am going to get a 2D echo and an exercise Myoview stress test to further evaluate.      Gary GessJonathan J. Lismary Kiehn MD FACP,FACC,FAHA, [facility] 11/15/2018 4:50 PM

## 2018-11-15 NOTE — Patient Instructions (Signed)
Medication Instructions:  NONE If you need a refill on your cardiac medications before your next appointment, please call your pharmacy.   Lab work: NONE If you have labs (blood work) drawn today and your tests are completely normal, you will receive your results only by: Marland Kitchen MyChart Message (if you have MyChart) OR . A paper copy in the mail If you have any lab test that is abnormal or we need to change your treatment, we will call you to review the results.  Testing/Procedures: Your physician has requested that you have an echocardiogram. Echocardiography is a painless test that uses sound waves to create images of your heart. It provides your doctor with information about the size and shape of your heart and how well your heart's chambers and valves are working. This procedure takes approximately one hour. There are no restrictions for this procedure.  Your physician has requested that you have en exercise stress myoview. For further information please visit [email or web address] Please follow instruction sheet, as given.    Follow-Up: At [facility], you and your health needs are our priority.  As part of our continuing mission to provide you with exceptional heart care, we have created designated Provider Care Teams.  These Care Teams include your primary Cardiologist (physician) and Advanced Practice Providers (APPs -  Physician Assistants and Nurse Practitioners) who all work together to provide you with the care you need, when you need it. . You will need a follow up appointment scheduled after your echocardiogram and exercise stress myoview studies. You may see Dr. Allyson Sabal or one of the following Advanced Practice Providers on your designated Care Team:   . Corine Shelter, [place] . Azalee Course, PA-C . Micah Flesher, PA-C . Joni Reining, DNP . Theodore Demark, PA-C . Judy Pimple, PA-C . Marjie Skiff, PA-C  Any Other Special Instructions Will Be Listed Below (If Applicable).

## 2018-11-15 NOTE — Assessment & Plan Note (Signed)
Gary Allen was referred by his PCP, Dr. Letitia Libra, for evaluation of several episodes of dyspnea on exertion beginning 2 months ago.  He has no cardiac risk factors.  He has never smoked.  He is unaware of his family history since he was adopted.  There is no history of diabetes, hypertension or hyperlipidemia.  He denies chest pain.  Beginning 2 months ago he had several episodes of dyspnea on exertion for unclear reasons.  I am going to get a 2D echo and an exercise Myoview stress test to further evaluate.

## 2018-11-16 ENCOUNTER — Telehealth (HOSPITAL_COMMUNITY): Payer: Self-pay

## 2018-11-16 NOTE — Telephone Encounter (Signed)
Encounter complete. 

## 2018-11-17 ENCOUNTER — Ambulatory Visit (HOSPITAL_COMMUNITY)
Admission: RE | Admit: 2018-11-17 | Discharge: 2018-11-17 | Disposition: A | Discharge status: Home / Self Care | Payer: BLUE CROSS/BLUE SHIELD | Source: Ambulatory Visit | Attending: Internal Medicine | Admitting: Internal Medicine

## 2018-11-17 DIAGNOSIS — R0609 Other forms of dyspnea: Secondary | ICD-10-CM | POA: Diagnosis not present

## 2018-11-17 LAB — MYOCARDIAL PERFUSION IMAGING
CHL CUP NUCLEAR SDS: 0
CHL CUP RESTING HR STRESS: 72 {beats}/min
CHL RATE OF PERCEIVED EXERTION: 19
CSEPED: 7 min
CSEPEDS: 10 s
CSEPHR: 90 %
Estimated workload: 8.8 METS
LV sys vol: 113 mL
LVDIAVOL: 45 mL (ref 62–150)
MPHR: 169 {beats}/min
Peak HR: 153 {beats}/min
SRS: 0
SSS: 0
TID: 1.03

## 2018-11-17 MED ORDER — TECHNETIUM TC 99M TETROFOSMIN IV KIT
32.1000 | PACK | Freq: Once | INTRAVENOUS | Status: AC | PRN
  Administered 2018-11-17: 32.1 via INTRAVENOUS
  Filled 2018-11-17: qty 33

## 2018-11-17 MED ORDER — TECHNETIUM TC 99M TETROFOSMIN IV KIT
10.4000 | PACK | Freq: Once | INTRAVENOUS | Status: AC | PRN
  Administered 2018-11-17: 10.4 via INTRAVENOUS
  Filled 2018-11-17: qty 11

## 2018-11-18 ENCOUNTER — Telehealth: Payer: Self-pay

## 2018-11-18 NOTE — Telephone Encounter (Signed)
Contacted pt regarding expected fax from PCP Dr. Dante Gang office. Unable to locate fax of most recent EKG from PCP office. Will request another copy be sent to [facility]. Pt agreeable with this

## 2018-11-22 ENCOUNTER — Telehealth: Payer: Self-pay

## 2018-11-22 NOTE — Telephone Encounter (Signed)
Pt aware that Dr. Allyson Sabal has reviewed his EKG results and that they are normal so no further recommendations at this time. Discussed scheduling echo since this was one of the two studies he was to get done before returning to office. Informed pt that message was sent to scheduling to set up echo appt with patient. Pt verbalized understanding

## 2018-11-23 ENCOUNTER — Other Ambulatory Visit: Payer: Self-pay

## 2018-11-23 DIAGNOSIS — R0609 Other forms of dyspnea: Principal | ICD-10-CM

## 2018-11-29 ENCOUNTER — Other Ambulatory Visit (HOSPITAL_COMMUNITY): Payer: Self-pay | Admitting: Cardiovascular Disease

## 2018-11-29 ENCOUNTER — Ambulatory Visit (HOSPITAL_COMMUNITY): Payer: BLUE CROSS/BLUE SHIELD | Attending: Internal Medicine

## 2018-11-29 DIAGNOSIS — R0600 Dyspnea, unspecified: Secondary | ICD-10-CM | POA: Diagnosis not present

## 2018-12-06 ENCOUNTER — Encounter: Payer: Self-pay | Admitting: Cardiovascular Disease

## 2018-12-06 ENCOUNTER — Ambulatory Visit (INDEPENDENT_AMBULATORY_CARE_PROVIDER_SITE_OTHER): Payer: BLUE CROSS/BLUE SHIELD | Admitting: Cardiovascular Disease

## 2018-12-06 DIAGNOSIS — R0609 Other forms of dyspnea: Secondary | ICD-10-CM

## 2018-12-06 NOTE — Progress Notes (Signed)
Gary Allen returns today for follow-up of his outpatient diagnostic test performed in the evaluation of dyspnea.  His Myoview stress test was entirely normal.  His 2D echo revealed normal LV systolic function, mild diastolic dysfunction with mild aortic stenosis.  I do not think these are contributory to his symptoms which have occurred infrequently and sporadically.  I will see him back PRN.  His recent lipid profile performed 03/01/2018 by his PCP revealed total cholesterol 191, LDL of 122 and HDL of 53.  Runell Gess, M.D., FACP, [facility], [facility] [address] [place], [place]  [address]  [phone] 12/06/2018 9:16 AM

## 2018-12-06 NOTE — Assessment & Plan Note (Signed)
Mr. Diantonio returns today for follow-up of his outpatient diagnostic test performed in the evaluation of dyspnea.  His Myoview stress test was entirely normal.  His 2D echo revealed normal LV systolic function, mild diastolic dysfunction with mild aortic stenosis.  I do not think these are contributory to his symptoms which have occurred infrequently and sporadically.  I will see him back PRN.

## 2018-12-06 NOTE — Patient Instructions (Signed)
Medication Instructions:  Your physician recommends that you continue on your current medications as directed. Please refer to the Current Medication list given to you today.  If you need a refill on your cardiac medications before your next appointment, please call your pharmacy.   Lab work: NONE If you have labs (blood work) drawn today and your tests are completely normal, you will receive your results only by: . MyChart Message (if you have MyChart) OR . A paper copy in the mail If you have any lab test that is abnormal or we need to change your treatment, we will call you to review the results.  Testing/Procedures: NONE  Follow-Up: At [facility], you and your health needs are our priority.  As part of our continuing mission to provide you with exceptional heart care, we have created designated Provider Care Teams.  These Care Teams include your primary Cardiologist (physician) and Advanced Practice Providers (APPs -  Physician Assistants and Nurse Practitioners) who all work together to provide you with the care you need, when you need it. . You may schedule a follow up appointment AS NEEDED. You may see Dr. Berry or one of the following Advanced Practice Providers on your designated Care Team:   . Luke Kilroy, PA-C . Hao Meng, PA-C . Angela Duke, PA-C . Kathryn Lawrence, DNP . Rhonda Barrett, PA-C . Krista Kroeger, PA-C . Callie Goodrich, PA-C    

## 2018-12-14 ENCOUNTER — Emergency Department: Payer: BLUE CROSS/BLUE SHIELD

## 2018-12-14 ENCOUNTER — Emergency Department
Admission: EM | Admit: 2018-12-14 | Discharge: 2018-12-15 | Disposition: A | Discharge status: Home / Self Care | Payer: BLUE CROSS/BLUE SHIELD | Attending: Emergency Medicine | Admitting: Emergency Medicine

## 2018-12-14 ENCOUNTER — Other Ambulatory Visit: Payer: Self-pay

## 2018-12-14 DIAGNOSIS — M542 Cervicalgia: Secondary | ICD-10-CM | POA: Diagnosis not present

## 2018-12-14 DIAGNOSIS — Y998 Other external cause status: Secondary | ICD-10-CM | POA: Diagnosis not present

## 2018-12-14 DIAGNOSIS — Y92410 Unspecified street and highway as the place of occurrence of the external cause: Secondary | ICD-10-CM | POA: Insufficient documentation

## 2018-12-14 DIAGNOSIS — M545 Low back pain: Secondary | ICD-10-CM | POA: Diagnosis not present

## 2018-12-14 DIAGNOSIS — R52 Pain, unspecified: Secondary | ICD-10-CM | POA: Diagnosis not present

## 2018-12-14 DIAGNOSIS — Y9389 Activity, other specified: Secondary | ICD-10-CM | POA: Insufficient documentation

## 2018-12-14 MED ORDER — MELOXICAM 15 MG PO TABS: 15.0000 mg | ORAL_TABLET | Freq: Every day | ORAL | 1 refills | Status: AC

## 2018-12-14 MED ORDER — METHOCARBAMOL 500 MG PO TABS
1000.0000 mg | ORAL_TABLET | Freq: Once | ORAL | Status: AC
Start: 2018-12-14 — End: 2018-12-14
  Administered 2018-12-14: 1000 mg via ORAL
  Filled 2018-12-14: qty 2

## 2018-12-14 MED ORDER — KETOROLAC TROMETHAMINE 30 MG/ML IJ SOLN
30.0000 mg | Freq: Once | INTRAMUSCULAR | Status: AC
  Administered 2018-12-14: 30 mg via INTRAMUSCULAR
  Filled 2018-12-14: qty 1

## 2018-12-14 MED ORDER — METHOCARBAMOL 500 MG PO TABS: 500.0000 mg | ORAL_TABLET | Freq: Three times a day (TID) | ORAL | 0 refills | Status: AC | PRN
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
## 2018-12-22 DIAGNOSIS — M25551 Pain in right hip: Secondary | ICD-10-CM | POA: Diagnosis not present

## 2018-12-22 DIAGNOSIS — M1611 Unilateral primary osteoarthritis, right hip: Secondary | ICD-10-CM | POA: Diagnosis not present

## 2019-01-31 DIAGNOSIS — M5416 Radiculopathy, lumbar region: Secondary | ICD-10-CM | POA: Diagnosis not present

## 2019-01-31 DIAGNOSIS — M48062 Spinal stenosis, lumbar region with neurogenic claudication: Secondary | ICD-10-CM | POA: Diagnosis not present

## 2019-02-28 DIAGNOSIS — L570 Actinic keratosis: Secondary | ICD-10-CM | POA: Diagnosis not present

## 2019-02-28 DIAGNOSIS — D2372 Other benign neoplasm of skin of left lower limb, including hip: Secondary | ICD-10-CM | POA: Diagnosis not present

## 2019-02-28 DIAGNOSIS — D2371 Other benign neoplasm of skin of right lower limb, including hip: Secondary | ICD-10-CM | POA: Diagnosis not present

## 2019-02-28 DIAGNOSIS — L821 Other seborrheic keratosis: Secondary | ICD-10-CM | POA: Diagnosis not present

## 2020-02-28 ENCOUNTER — Other Ambulatory Visit: Payer: Self-pay

## 2020-02-28 ENCOUNTER — Ambulatory Visit: Payer: Self-pay | Admitting: Medical

## 2020-02-28 ENCOUNTER — Encounter: Payer: Self-pay | Admitting: Medical

## 2020-02-28 VITALS — BP 170/96 | HR 77 | Temp 98.0°F | Resp 18 | Wt 244.0 lb

## 2020-02-28 DIAGNOSIS — J301 Allergic rhinitis due to pollen: Secondary | ICD-10-CM

## 2020-02-28 DIAGNOSIS — J392 Other diseases of pharynx: Secondary | ICD-10-CM

## 2020-02-28 DIAGNOSIS — K219 Gastro-esophageal reflux disease without esophagitis: Secondary | ICD-10-CM

## 2020-02-28 DIAGNOSIS — H6983 Other specified disorders of Eustachian tube, bilateral: Secondary | ICD-10-CM

## 2020-02-28 NOTE — Progress Notes (Signed)
Subjective:    Patient ID: Gary Allen, male    DOB: 1966/07/24, 54 y.o.   MRN: [id number]  HPI  54 yo male in non acute distress. Presents today with scratchy throat with feeling like he has cough. No nasal congestion.  HA  Last night on forehead area , mild per patient.  No nasal discharge or  productive cough. Intermittent eye itching. Denies fever , chills, chest pain, facial pain.  Blood pressure (!) 170/96, pulse 77, temperature 98 F (36.7 C), resp. rate 18, weight 244 lb (110.7 kg), SpO2 99 %.    No Known Allergies  Current Outpatient Medications:  .  APPLE CIDER VINEGAR PO, Take 1,000 mg by mouth., Disp: , Rfl:  .  MAGNESIUM PO, Take by mouth daily., Disp: , Rfl:    Dr. Michaela Corner is his primary.  Patient was donating blood and was told his iron was too low for him to donate. On his own he picked up some Iron , and after a month he went back and the donation place said his labs were fine and that he could donate blood this time.  on OTC Iron x 3 months  Also takes, Mg, balance of nature fruit and vegetable gummies, and a  fiber bar. Rarely has constipation.  Review of Systems  Constitutional: Negative for chills and fever.  HENT: Positive for sore throat. Negative for ear pain, hearing loss, postnasal drip, rhinorrhea, sinus pressure and sinus pain.   Eyes: Positive for itching (intermittent, scratching). Negative for discharge.  Respiratory: Positive for cough. Negative for chest tightness and shortness of breath.   Cardiovascular: Negative for chest pain.  Gastrointestinal: Negative for diarrhea, nausea and vomiting.  Musculoskeletal: Negative for myalgias.  Skin: Negative for rash.  Allergic/Immunologic: Positive for environmental allergies. Negative for food allergies and immunocompromised state.  Neurological: Positive for headaches. Negative for seizures, syncope and speech difficulty.  Hematological: Negative for adenopathy.  Psychiatric/Behavioral:  Negative for agitation, behavioral problems, self-injury and suicidal ideas.       Objective:   Physical Exam Constitutional:      Appearance: Normal appearance.  HENT:     Head: Normocephalic and atraumatic.     Right Ear: Hearing, ear canal and external ear normal. A middle ear effusion (right worse then left.) is present.     Left Ear: Hearing, ear canal and external ear normal. A middle ear effusion is present.     Mouth/Throat:     Lips: Pink.     Mouth: Mucous membranes are moist.     Tongue: No lesions. Tongue does not deviate from midline.     Palate: No mass and lesions.     Pharynx: Oropharynx is clear. Uvula midline. No pharyngeal swelling, oropharyngeal exudate, posterior oropharyngeal erythema (red due to red cough drops) or uvula swelling.     Tonsils: No tonsillar exudate or tonsillar abscesses.  Eyes:     Extraocular Movements: Extraocular movements intact.     Conjunctiva/sclera: Conjunctivae normal.     Pupils: Pupils are equal, round, and reactive to light.  Cardiovascular:     Rate and Rhythm: Normal rate and regular rhythm.     Heart sounds: Normal heart sounds.  Pulmonary:     Effort: Pulmonary effort is normal.     Breath sounds: Normal breath sounds.  Musculoskeletal:        General: Normal range of motion.     Cervical back: Normal range of motion and neck supple.  Skin:  General: Skin is warm and dry.     Capillary Refill: Capillary refill takes less than 2 seconds.  Neurological:     General: No focal deficit present.     Mental Status: He is alert and oriented to person, place, and time.  Psychiatric:        Mood and Affect: Mood normal.        Behavior: Behavior normal.        Thought Content: Thought content normal.        Judgment: Judgment normal.           Assessment & Plan:  Seasonal allergies, hx of allergic conjunctivitis Allergic Rhinitis, Eustachian tube dysfunction. Irritated throat GERD Elevated blood pressure Try  non-sedative antihistimine x 10 days if not working switch to another non-sedative antihistimne. Use OTC Flonase for PND. Try OTC Flonase take as directed on package.   To return  To clinic if cough becomes productive or nasal discharge is green. Fever over 101 degrees.. Patient verbalizes understanding and has no questions at discharge. Reviewed with patient to follow up with his doctor for recheck of his blood work (hx of low iron) and for referral for endoscopy. Reviewed medications to avoid due to elevated blood pressure.

## 2020-02-28 NOTE — Patient Instructions (Signed)
Heartburn Heartburn is a type of pain or discomfort that can happen in the throat or chest. It is often described as a burning pain. It may also cause a bad, acid-like taste in the mouth. Heartburn may feel worse when you lie down or bend over. It may be worse at night. It may be caused by stomach contents that move back up (reflux) into the tube that connects the mouth with the stomach (esophagus). Follow these instructions at home: Eating and drinking   Avoid certain foods and drinks as told by your doctor. This may include: ? Coffee and tea (with or without caffeine). ? Drinks that have alcohol. ? Energy drinks and sports drinks. ? Carbonated drinks or sodas. ? Chocolate and cocoa. ? Peppermint and mint flavorings. ? Garlic and onions. ? Horseradish. ? Spicy and acidic foods, such as:  Peppers.  Chili powder and curry powder.  Vinegar.  Hot sauces and BBQ sauce. ? Citrus fruit juices and citrus fruits, such as:  Oranges.  Lemons.  Limes. ? Tomato-based foods, such as:  Red sauce and pizza with red sauce.  Chili.  Salsa. ? Fried and fatty foods, such as:  Donuts.  French fries and potato chips.  High-fat dressings. ? High-fat meats, such as:  Hot dogs and sausage.  Rib eye steak.  Ham and bacon. ? High-fat dairy items, such as:  Whole milk.  Butter.  Cream cheese.  Eat small meals often. Avoid eating large meals.  Avoid drinking large amounts of liquid with your meals.  Avoid eating meals during the 2-3 hours before bedtime.  Avoid lying down right after you eat.  Do not exercise right after you eat. Lifestyle      If you are overweight, lose an amount of weight that is healthy for you. Ask your doctor about a safe weight loss goal.  Do not use any products that contain nicotine or tobacco, including cigarettes, e-cigarettes, and chewing tobacco. These can make your symptoms worse. If you need help quitting, ask your doctor.  Wear loose  clothes. Do not wear anything tight around your waist.  Raise (elevate) the head of your bed about 6 inches (15 cm) when you sleep.  Try to lower your stress. If you need help doing this, ask your doctor. General instructions  Pay attention to any changes in your symptoms.  Take over-the-counter and prescription medicines only as told by your doctor. ? Do not take aspirin, ibuprofen, or other NSAIDs unless your doctor says it is okay. ? Stop medicines only as told by your doctor.  Keep all follow-up visits as told by your doctor. This is important. Contact a doctor if:  You have new symptoms.  You lose weight and you do not know why it is happening.  You have trouble swallowing, or it hurts to swallow.  You have wheezing or a cough that keeps happening.  Your symptoms do not get better with treatment.  You have heartburn often for more than 2 weeks. Get help right away if:  You have pain in your arms, neck, jaw, teeth, or back.  You feel sweaty, dizzy, or light-headed.  You have chest pain or shortness of breath.  You throw up (vomit) and your throw up looks like blood or coffee grounds.  Your poop (stool) is bloody or black. These symptoms may represent a serious problem that is an emergency. Do not wait to see if the symptoms will go away. Get medical help right away. Call your local   emergency services (911 in the [place] Do not drive yourself to the hospital. Summary  Heartburn is a type of pain that can happen in the throat or chest. It can feel like a burning pain. It may also cause a bad, acid-like taste in the mouth.  You may need to avoid certain foods and drinks to help your symptoms. Ask your doctor what foods and drinks you should avoid.  Take over-the-counter and prescription medicines only as told by your doctor. Do not take aspirin, ibuprofen, or other NSAIDs unless your doctor told you to do so.  Contact your doctor if your symptoms do not get better or  they get worse. This information is not intended to replace advice given to you by your health care provider. Make sure you discuss any questions you have with your health care provider. Document Revised: 02/28/2018 Document Reviewed: 02/28/2018 Elsevier Patient Education  [employer]. Postnasal Drip Postnasal drip is the feeling of mucus going down the back of your throat. Mucus is a slimy substance that moistens and cleans your nose and throat, as well as the air pockets in face bones near your forehead and cheeks (sinuses). Small amounts of mucus pass from your nose and sinuses down the back of your throat all the time. This is normal. When you produce too much mucus or the mucus gets too thick, you can feel it. Some common causes of postnasal drip include:  Having more mucus because of: ? A cold or the flu. ? Allergies. ? Cold air. ? Certain medicines.  Having more mucus that is thicker because of: ? A sinus or nasal infection. ? Dry air. ? A food allergy. Follow these instructions at home: Relieving discomfort   Gargle with a salt-water mixture 3-4 times a day or as needed. To make a salt-water mixture, completely dissolve -1 tsp of salt in 1 cup of warm water.  If the air in your home is dry, use a humidifier to add moisture to the air.  Use a saline spray or container (neti pot) to flush out the nose (nasal irrigation). These methods can help clear away mucus and keep the nasal passages moist. General instructions  Take over-the-counter and prescription medicines only as told by your health care provider.  Follow instructions from your health care provider about eating or drinking restrictions. You may need to avoid caffeine.  Avoid things that you know you are allergic to (allergens), like dust, mold, pollen, pets, or certain foods.  Drink enough fluid to keep your urine pale yellow.  Keep all follow-up visits as told by your health care provider. This is  important. Contact a health care provider if:  You have a fever.  You have a sore throat.  You have difficulty swallowing.  You have headache.  You have sinus pain.  You have a cough that does not go away.  The mucus from your nose becomes thick and is green or yellow in color.  You have cold or flu symptoms that last more than 10 days. Summary  Postnasal drip is the feeling of mucus going down the back of your throat.  If your health care provider approves, use nasal irrigation or a nasal spray 2?4 times a day.  Avoid things that you know you are allergic to (allergens), like dust, mold, pollen, pets, or certain foods. This information is not intended to replace advice given to you by your health care provider. Make sure you discuss any questions you have with  your health care provider. Document Revised: 01/20/2019 Document Reviewed: 01/11/2017 Elsevier Patient Education  2020 [employer]. Allergic Rhinitis, Adult Allergic rhinitis is a reaction to allergens in the air. Allergens are tiny specks (particles) in the air that cause your body to have an allergic reaction. This condition cannot be passed from person to person (is not contagious). Allergic rhinitis cannot be cured, but it can be controlled. There are two types of allergic rhinitis:  Seasonal. This type is also called hay fever. It happens only during certain times of the year.  Perennial. This type can happen at any time of the year. What are the causes? This condition may be caused by:  Pollen from grasses, trees, and weeds.  House dust mites.  Pet dander.  Mold. What are the signs or symptoms? Symptoms of this condition include:  Sneezing.  Runny or stuffy nose (nasal congestion).  A lot of mucus in the back of the throat (postnasal drip).  Itchy nose.  Tearing of the eyes.  Trouble sleeping.  Being sleepy during day. How is this treated? There is no cure for this condition. You should  avoid things that trigger your symptoms (allergens). Treatment can help to relieve symptoms. This may include:  Medicines that block allergy symptoms, such as antihistamines. These may be given as a shot, nasal spray, or pill.  Shots that are given until your body becomes less sensitive to the allergen (desensitization).  Stronger medicines, if all other treatments have not worked. Follow these instructions at home: Avoiding allergens   Find out what you are allergic to. Common allergens include smoke, dust, and pollen.  Avoid them if you can. These are some of the things that you can do to avoid allergens: ? Replace carpet with wood, tile, or vinyl flooring. Carpet can trap dander and dust. ? Clean any mold found in the home. ? Do not smoke. Do not allow smoking in your home. ? Change your heating and air conditioning filter at least once a month. ? During allergy season:  Keep windows closed as much as you can. If possible, use air conditioning when there is a lot of pollen in the air.  Use a special filter for allergies with your furnace and air conditioner.  Plan outdoor activities when pollen counts are lowest. This is usually during the early morning or evening hours.  If you do go outdoors when pollen count is high, wear a special mask for people with allergies.  When you come indoors, take a shower and change your clothes before sitting on furniture or bedding. General instructions  Do not use fans in your home.  Do not hang clothes outside to dry.  Wear sunglasses to keep pollen out of your eyes.  Wash your hands right away after you touch household pets.  Take over-the-counter and prescription medicines only as told by your doctor.  Keep all follow-up visits as told by your doctor. This is important. Contact a doctor if:  You have a fever.  You have a cough that does not go away (is persistent).  You start to make whistling sounds when you breathe  (wheeze).  Your symptoms do not get better with treatment.  You have thick fluid coming from your nose.  You start to have nosebleeds. Get help right away if:  Your tongue or your lips are swollen.  You have trouble breathing.  You feel dizzy or you feel like you are going to pass out (faint).  You have cold  sweats. Summary  Allergic rhinitis is a reaction to allergens in the air.  This condition may be caused by allergens. These include pollen, dust mites, pet dander, and mold.  Symptoms include a runny, itchy nose, sneezing, or tearing eyes. You may also have trouble sleeping or feel sleepy during the day.  Treatment includes taking medicines and avoiding allergens. You may also get shots or take stronger medicines.  Get help if you have a fever or a cough that does not stop. Get help right away if you are short of breath. This information is not intended to replace advice given to you by your health care provider. Make sure you discuss any questions you have with your health care provider. Document Revised: 01/17/2019 Document Reviewed: 04/19/2018 Elsevier Patient Education  2020 [employer].

## 2020-03-07 ENCOUNTER — Other Ambulatory Visit: Payer: Self-pay

## 2020-03-07 DIAGNOSIS — R739 Hyperglycemia, unspecified: Secondary | ICD-10-CM

## 2020-03-08 LAB — URINALYSIS, ROUTINE W REFLEX MICROSCOPIC
Bilirubin, UA: NEGATIVE
Glucose, UA: NEGATIVE
Ketones, UA: NEGATIVE
Leukocytes,UA: NEGATIVE
Nitrite, UA: NEGATIVE
Protein,UA: NEGATIVE
RBC, UA: NEGATIVE
Specific Gravity, UA: 1.007 (ref 1.005–1.030)
Urobilinogen, Ur: 0.2 mg/dL (ref 0.2–1.0)
pH, UA: 8 — ABNORMAL HIGH (ref 5.0–7.5)

## 2020-03-08 LAB — COMPREHENSIVE METABOLIC PANEL
ALT: 23 IU/L (ref 0–44)
AST: 27 IU/L (ref 0–40)
Albumin/Globulin Ratio: 1.5 (ref 1.2–2.2)
Albumin: 4.1 g/dL (ref 3.8–4.9)
Alkaline Phosphatase: 64 IU/L (ref 48–121)
BUN/Creatinine Ratio: 8 — ABNORMAL LOW (ref 9–20)
BUN: 8 mg/dL (ref 6–24)
Bilirubin Total: 0.5 mg/dL (ref 0.0–1.2)
CO2: 21 mmol/L (ref 20–29)
Calcium: 8.7 mg/dL (ref 8.7–10.2)
Chloride: 103 mmol/L (ref 96–106)
Creatinine, Ser: 1.06 mg/dL (ref 0.76–1.27)
GFR calc Af Amer: 93 mL/min/{1.73_m2} (ref >59)
GFR calc non Af Amer: 80 mL/min/{1.73_m2} (ref >59)
Globulin, Total: 2.7 g/dL (ref 1.5–4.5)
Glucose: 91 mg/dL (ref 65–99)
Potassium: 4.3 mmol/L (ref 3.5–5.2)
Sodium: 138 mmol/L (ref 134–144)
Total Protein: 6.8 g/dL (ref 6.0–8.5)

## 2020-03-08 LAB — LIPID PANEL WITH LDL/HDL RATIO
Cholesterol, Total: 190 mg/dL (ref 100–199)
HDL: 55 mg/dL (ref >39)
LDL Chol Calc (NIH): 123 mg/dL — ABNORMAL HIGH (ref 0–99)
LDL/HDL Ratio: 2.2 ratio (ref 0.0–3.6)
Triglycerides: 67 mg/dL (ref 0–149)
VLDL Cholesterol Cal: 12 mg/dL (ref 5–40)

## 2020-03-08 LAB — CBC WITH DIFFERENTIAL/PLATELET
Basophils Absolute: 0.1 10*3/uL (ref 0.0–0.2)
Basos: 1 %
EOS (ABSOLUTE): 0.5 10*3/uL — ABNORMAL HIGH (ref 0.0–0.4)
Eos: 7 %
Hematocrit: 45.3 % (ref 37.5–51.0)
Hemoglobin: 15.1 g/dL (ref 13.0–17.7)
Immature Grans (Abs): 0 10*3/uL (ref 0.0–0.1)
Immature Granulocytes: 0 %
Lymphocytes Absolute: 1.2 10*3/uL (ref 0.7–3.1)
Lymphs: 20 %
MCH: 27.5 pg (ref 26.6–33.0)
MCHC: 33.3 g/dL (ref 31.5–35.7)
MCV: 82 fL (ref 79–97)
Monocytes Absolute: 1 10*3/uL — ABNORMAL HIGH (ref 0.1–0.9)
Monocytes: 16 %
Neutrophils Absolute: 3.4 10*3/uL (ref 1.4–7.0)
Neutrophils: 56 %
Platelets: 269 10*3/uL (ref 150–450)
RBC: 5.5 x10E6/uL (ref 4.14–5.80)
RDW: 14.6 % (ref 11.6–15.4)
WBC: 6.1 10*3/uL (ref 3.4–10.8)

## 2020-03-08 LAB — PSA: Prostate Specific Ag, Serum: 0.6 ng/mL (ref 0.0–4.0)

## 2020-03-29 ENCOUNTER — Other Ambulatory Visit: Payer: Self-pay

## 2020-04-02 ENCOUNTER — Other Ambulatory Visit: Payer: Self-pay

## 2020-04-02 DIAGNOSIS — Z111 Encounter for screening for respiratory tuberculosis: Secondary | ICD-10-CM

## 2020-04-03 LAB — QUANTIFERON-TB GOLD PLUS

## 2020-04-04 NOTE — Progress Notes (Signed)
Please communicate to patient and reschedule. Thank you. Herbert Seta

## 2020-04-05 ENCOUNTER — Other Ambulatory Visit: Payer: Self-pay

## 2020-04-05 DIAGNOSIS — Z0000 Encounter for general adult medical examination without abnormal findings: Secondary | ICD-10-CM

## 2020-04-08 LAB — QUANTIFERON-TB GOLD PLUS
QuantiFERON Mitogen Value: >10 IU/mL
QuantiFERON Nil Value: 0.02 IU/mL
QuantiFERON TB1 Ag Value: 0.02 IU/mL
QuantiFERON TB2 Ag Value: 0.03 IU/mL
QuantiFERON-TB Gold Plus: NEGATIVE

## 2021-04-21 ENCOUNTER — Ambulatory Visit: Payer: Self-pay | Admitting: Medical

## 2021-08-11 ENCOUNTER — Other Ambulatory Visit: Payer: Self-pay | Admitting: Orthopedic Surgery

## 2021-08-11 ENCOUNTER — Other Ambulatory Visit (HOSPITAL_COMMUNITY): Payer: Self-pay | Admitting: Orthopedic Surgery

## 2021-08-11 DIAGNOSIS — M4807 Spinal stenosis, lumbosacral region: Secondary | ICD-10-CM

## 2021-08-19 ENCOUNTER — Other Ambulatory Visit: Payer: BLUE CROSS/BLUE SHIELD

## 2021-08-20 ENCOUNTER — Other Ambulatory Visit: Payer: Self-pay

## 2021-08-20 ENCOUNTER — Ambulatory Visit
Admission: RE | Admit: 2021-08-20 | Discharge: 2021-08-20 | Disposition: A | Discharge status: Home / Self Care | Payer: BC Managed Care – PPO | Source: Ambulatory Visit | Attending: Orthopedic Surgery | Admitting: Orthopedic Surgery

## 2021-08-20 DIAGNOSIS — M4807 Spinal stenosis, lumbosacral region: Secondary | ICD-10-CM

## 2021-08-21 ENCOUNTER — Other Ambulatory Visit: Payer: Self-pay | Admitting: Orthopedic Surgery

## 2021-08-21 DIAGNOSIS — M4807 Spinal stenosis, lumbosacral region: Secondary | ICD-10-CM

## 2021-08-22 ENCOUNTER — Other Ambulatory Visit: Payer: Self-pay

## 2021-08-22 ENCOUNTER — Ambulatory Visit
Admission: RE | Admit: 2021-08-22 | Discharge: 2021-08-22 | Disposition: A | Discharge status: Home / Self Care | Payer: BC Managed Care – PPO | Source: Ambulatory Visit | Attending: Orthopedic Surgery | Admitting: Orthopedic Surgery

## 2021-08-22 DIAGNOSIS — M4807 Spinal stenosis, lumbosacral region: Secondary | ICD-10-CM

## 2022-11-03 ENCOUNTER — Ambulatory Visit
Admission: EM | Admit: 2022-11-03 | Discharge: 2022-11-03 | Disposition: A | Discharge status: Home / Self Care | Payer: BC Managed Care – PPO | Attending: Emergency Medicine | Admitting: Emergency Medicine

## 2022-11-03 DIAGNOSIS — J069 Acute upper respiratory infection, unspecified: Secondary | ICD-10-CM

## 2022-11-03 DIAGNOSIS — R051 Acute cough: Secondary | ICD-10-CM | POA: Diagnosis not present

## 2022-11-03 MED ORDER — IPRATROPIUM BROMIDE 0.06 % NA SOLN: 2.0000 | Freq: Four times a day (QID) | NASAL | 12 refills | Status: AC

## 2022-11-03 MED ORDER — BENZONATATE 100 MG PO CAPS: 200.0000 mg | ORAL_CAPSULE | Freq: Three times a day (TID) | ORAL | 0 refills | Status: AC

## 2022-11-03 MED ORDER — PROMETHAZINE-DM 6.25-15 MG/5ML PO SYRP: 5.0000 mL | ORAL_SOLUTION | Freq: Four times a day (QID) | ORAL | 0 refills | Status: AC | PRN

## 2022-11-03 MED ORDER — AMOXICILLIN-POT CLAVULANATE 875-125 MG PO TABS: 1.0000 | ORAL_TABLET | Freq: Two times a day (BID) | ORAL | 0 refills | Status: AC

## 2022-11-03 NOTE — ED Provider Notes (Signed)
[facility]    CSN: [id number] Arrival date & time: 11/03/22  1157      History   Chief Complaint Chief Complaint  Patient presents with   Cough   Congestion    HPI Gary Allen is a 57 y.o. male.   HPI  56 year old male here for evaluation of nasal congestion and cough.  The patient reports that his symptoms have been going on for last 4 weeks.  They intensify when he lays down as this triggers his sinuses to drain increases the postnasal drip and cough.  He reports that he will get up and blow his nose for a light yellow mucus and then go back to bed.  He reports that he is up and down all night long blowing his nose.  He is congested all day long but the drainage improves when he is upright.  He has not had any fever, sinus pressure, shortness of breath, or wheezing.    Past Medical History:  Diagnosis Date   Broken bones 1992   Right Hand    Patient Active Problem List   Diagnosis Date Noted   Dyspnea on exertion 11/15/2018    History reviewed. No pertinent surgical history.     Home Medications    Prior to Admission medications   Medication Sig Start Date End Date Taking? Authorizing Provider  amoxicillin-clavulanate (AUGMENTIN) 875-125 MG tablet Take 1 tablet by mouth every 12 (twelve) hours for 10 days. 11/03/22 11/13/22 Yes Becky Augusta, NP  benzonatate (TESSALON) 100 MG capsule Take 2 capsules (200 mg total) by mouth every 8 (eight) hours. 11/03/22  Yes Becky Augusta, NP  ipratropium (ATROVENT) 0.06 % nasal spray Place 2 sprays into both nostrils 4 (four) times daily. 11/03/22  Yes Becky Augusta, NP  promethazine-dextromethorphan (PROMETHAZINE-DM) 6.25-15 MG/5ML syrup Take 5 mLs by mouth 4 (four) times daily as needed. 11/03/22  Yes Becky Augusta, NP    Family History History reviewed. No pertinent family history.  Social History Social History   Tobacco Use   Smoking status: Never   Smokeless tobacco: Never  Substance Use Topics    Alcohol use: Yes    Comment: rarely   Drug use: Never     Allergies   Patient has no known allergies.   Review of Systems Review of Systems  Constitutional:  Negative for fever.  HENT:  Positive for congestion, postnasal drip and rhinorrhea. Negative for ear pain, sinus pressure and sore throat.   Respiratory:  Positive for cough. Negative for shortness of breath.      Physical Exam Triage Vital Signs ED Triage Vitals  Enc Vitals Group     BP      Pulse      Resp      Temp      Temp src      SpO2      Weight      Height      Head Circumference      Peak Flow      Pain Score      Pain Loc      Pain Edu?      Excl. in GC?    No data found.  Updated Vital Signs BP (!) 160/82 (BP Location: Left Arm)   Pulse 71   Temp 98.2 F (36.8 C) (Oral)   Resp 16   Ht 5\' 11"  (1.803 m)   Wt 253 lb (114.8 kg)   SpO2 96%   BMI 35.29 kg/m  Visual Acuity Right Eye Distance:   Left Eye Distance:   Bilateral Distance:    Right Eye Near:   Left Eye Near:    Bilateral Near:     Physical Exam Vitals and nursing note reviewed.  Constitutional:      Appearance: Normal appearance. He is not ill-appearing.  HENT:     Head: Normocephalic and atraumatic.     Right Ear: Tympanic membrane, ear canal and external ear normal. There is no impacted cerumen.     Left Ear: Tympanic membrane, ear canal and external ear normal. There is no impacted cerumen.     Nose: Congestion and rhinorrhea present.     Comments: This mucosa is erythematous and edematous with light yellow discharge in both nares.  No tenderness to compression of frontal or maxillary sinuses.    Mouth/Throat:     Mouth: Mucous membranes are moist.     Pharynx: Oropharynx is clear. Posterior oropharyngeal erythema present. No oropharyngeal exudate.     Comments: Posterior oropharynx has mild erythema but no injection.  There is light yellow postnasal drip in the posterior oropharynx. Cardiovascular:     Rate and  Rhythm: Normal rate and regular rhythm.     Pulses: Normal pulses.     Heart sounds: Normal heart sounds. No murmur heard.    No friction rub. No gallop.  Pulmonary:     Effort: Pulmonary effort is normal.     Breath sounds: Normal breath sounds. No wheezing, rhonchi or rales.  Musculoskeletal:     Cervical back: Normal range of motion and neck supple.  Lymphadenopathy:     Cervical: No cervical adenopathy.  Skin:    General: Skin is warm and dry.     Capillary Refill: Capillary refill takes less than 2 seconds.  Neurological:     General: No focal deficit present.     Mental Status: He is alert and oriented to person, place, and time.      UC Treatments / Results  Labs (all labs ordered are listed, but only abnormal results are displayed) Labs Reviewed - No data to display  EKG   Radiology No results found.  Procedures Procedures (including critical care time)  Medications Ordered in UC Medications - No data to display  Initial Impression / Assessment and Plan / UC Course  I have reviewed the triage vital signs and the nursing notes.  Pertinent labs & imaging results that were available during my care of the patient were reviewed by me and considered in my medical decision making (see chart for details).   Patient is a pleasant, nontoxic-appearing 57 year old male here for evaluation of 4 weeks worth of nasal congestion with postnasal drip and cough.  The symptoms intensify when he lays down.  They have not been associated with any fever or sinus pressure.  No shortness of breath or wheezing.  He has been using cough drops to help with the symptoms without significant improvement.  On exam he does have inflammation of his nasal mucosa with light yellow nasal discharge and postnasal drip.  Cardiopulmonary exam reveals clear lung sounds in all fields.  I suspect that patient has a upper respiratory infection and given the duration of symptoms a trial of antibiotics is  warranted.  I will place him on Augmentin 875 twice daily for 10 days for treatment of URI.  Discussed using sinus irrigation to help alleviate the mucus burden.  We have also discussed using Atrovent nasal spray to help with the  congestion.  I am going to prescribe Tessalon Perles and Promethazine DM cough syrup to help with cough and congestion as well.  Return precautions reviewed.   Final Clinical Impressions(s) / UC Diagnoses   Final diagnoses:  Upper respiratory tract infection, unspecified type  Acute cough     Discharge Instructions      The Augmentin twice daily with food for 10 days for treatment of your URI.  Perform sinus irrigation 2-3 times a day with a [employer]Med sinus rinse kit and distilled water.  Do not use tap water.  You can use plain over-the-counter Mucinex every 6 hours to break up the stickiness of the mucus so your body can clear it.  Increase your oral fluid intake to thin out your mucus so that is also able for your body to clear more easily.  Take an over-the-counter probiotic, such as Culturelle-align-activia, 1 hour after each dose of antibiotic to prevent diarrhea.  Use the Atrovent nasal spray, 2 squirts in each nostril every 6 hours, as needed for runny nose and postnasal drip.  Use the Tessalon Perles every 8 hours during the day.  Take them with a small sip of water.  They may give you some numbness to the base of your tongue or a metallic taste in your mouth, this is normal.  Use the Promethazine DM cough syrup at bedtime for cough and congestion.  It will make you drowsy so do not take it during the day.  If you develop any new or worsening symptoms return for reevaluation or see your primary care provider.      ED Prescriptions     Medication Sig Dispense Auth. Provider   amoxicillin-clavulanate (AUGMENTIN) 875-125 MG tablet Take 1 tablet by mouth every 12 (twelve) hours for 10 days. 20 tablet Margarette Canada, NP   benzonatate (TESSALON) 100 MG  capsule Take 2 capsules (200 mg total) by mouth every 8 (eight) hours. 21 capsule Margarette Canada, NP   ipratropium (ATROVENT) 0.06 % nasal spray Place 2 sprays into both nostrils 4 (four) times daily. 15 mL Margarette Canada, NP   promethazine-dextromethorphan (PROMETHAZINE-DM) 6.25-15 MG/5ML syrup Take 5 mLs by mouth 4 (four) times daily as needed. 118 mL Margarette Canada, NP      PDMP not reviewed this encounter.   Margarette Canada, NP 11/03/22 1409

## 2022-11-03 NOTE — Discharge Instructions (Signed)
The Augmentin twice daily with food for 10 days for treatment of your URI.  Perform sinus irrigation 2-3 times a day with a [employer]Med sinus rinse kit and distilled water.  Do not use tap water.  You can use plain over-the-counter Mucinex every 6 hours to break up the stickiness of the mucus so your body can clear it.  Increase your oral fluid intake to thin out your mucus so that is also able for your body to clear more easily.  Take an over-the-counter probiotic, such as Culturelle-align-activia, 1 hour after each dose of antibiotic to prevent diarrhea.  Use the Atrovent nasal spray, 2 squirts in each nostril every 6 hours, as needed for runny nose and postnasal drip.  Use the Tessalon Perles every 8 hours during the day.  Take them with a small sip of water.  They may give you some numbness to the base of your tongue or a metallic taste in your mouth, this is normal.  Use the Promethazine DM cough syrup at bedtime for cough and congestion.  It will make you drowsy so do not take it during the day.  If you develop any new or worsening symptoms return for reevaluation or see your primary care provider.  

## 2022-11-03 NOTE — ED Triage Notes (Signed)
Pt c/o cough & congestion x4 wks. Cough worse when lying, denies any wheezing,sob fevers,chills or bodyaches.

## 2024-06-30 ENCOUNTER — Emergency Department
Admission: EM | Admit: 2024-06-30 | Discharge: 2024-06-30 | Disposition: A | Discharge status: Home / Self Care | Payer: Self-pay | Source: Skilled Nursing Facility | Attending: Emergency Medicine | Admitting: Emergency Medicine

## 2024-06-30 DIAGNOSIS — T18128A Food in esophagus causing other injury, initial encounter: Secondary | ICD-10-CM | POA: Insufficient documentation

## 2024-06-30 DIAGNOSIS — W44F3XA Food entering into or through a natural orifice, initial encounter: Secondary | ICD-10-CM | POA: Insufficient documentation

## 2024-06-30 MED ORDER — ACETAMINOPHEN 160 MG/5ML PO SOLN
500.0000 mg | Freq: Once | ORAL | Status: AC
Start: 2024-06-30 — End: 2024-06-30
  Administered 2024-06-30: 500 mg via ORAL
  Filled 2024-06-30: qty 20.3

## 2024-06-30 MED ORDER — LIDOCAINE VISCOUS HCL 2 % MT SOLN
15.0000 mL | Freq: Once | OROMUCOSAL | Status: AC
  Administered 2024-06-30: 15 mL via OROMUCOSAL
  Filled 2024-06-30: qty 15

## 2024-06-30 MED ORDER — GLUCAGON HCL RDNA (DIAGNOSTIC) 1 MG IJ SOLR
1.0000 mg | Freq: Once | INTRAMUSCULAR | Status: AC
  Administered 2024-06-30: 1 mg via INTRAMUSCULAR
  Filled 2024-06-30: qty 1

## 2024-06-30 NOTE — ED Triage Notes (Signed)
 Pt arrives with his spouse who reports he had some steak and started choking, and feels like he has some steak caught, says he is unable to swallow, spitting and having hiccups.

## 2024-06-30 NOTE — Discharge Instructions (Addendum)
 As we discussed please be careful with eating any meats.  It is important that you cut the pieces very small and make sure that you chew them well.  If you feel like any food gets stuck again please return to the emergency department.

## 2024-06-30 NOTE — ED Triage Notes (Signed)
 Pt has steak stuck in his throat for the past 2 hours, hx of the same, unable to maintain secretions, pt having hiccups

## 2024-06-30 NOTE — ED Provider Notes (Signed)
 [facility] Provider Note    Event Date/Time   First MD Initiated Contact with Patient 06/30/24 1950     (approximate)   History   Choking   HPI  Gary Allen is a 58 y.o. male who presents to the emergency department today because of concerns for esophageal food impaction.  The patient was eating steak about 2 hours ago when he felt like a piece of steak stuck in his throat.  He did try drinking however has not been able to pass any fluid.  It does result in him vomiting.  He did bring up a small amount of meat although still feels like it is stuck.  The patient says that he did have his esophagus stretched about 6 years ago.     Physical Exam   Triage Vital Signs: ED Triage Vitals  Encounter Vitals Group     BP 06/30/24 1938 (!) 162/96     Girls Systolic BP Percentile --      Girls Diastolic BP Percentile --      Boys Systolic BP Percentile --      Boys Diastolic BP Percentile --      Pulse Rate 06/30/24 1938 86     Resp 06/30/24 1938 20     Temp 06/30/24 1942 98.5 F (36.9 C)     Temp Source 06/30/24 1942 Oral     SpO2 06/30/24 1938 100 %     Weight 06/30/24 1942 240 lb (108.9 kg)     Height --      Head Circumference --      Peak Flow --      Pain Score 06/30/24 1942 9     Pain Loc --      Pain Education --      Exclude from Growth Chart --     Most recent vital signs: Vitals:   06/30/24 1938 06/30/24 1942  BP: (!) 162/96   Pulse: 86   Resp: 20   Temp:  98.5 F (36.9 C)  SpO2: 100%    General: Awake, alert, oriented. CV:  Good peripheral perfusion. Regular rate and rhythm. Resp:  Normal effort. Lungs clear. Abd:  No distention.   ED Results / Procedures / Treatments   Labs (all labs ordered are listed, but only abnormal results are displayed) Labs Reviewed - No data to display   EKG  None   RADIOLOGY None   PROCEDURES:  Critical Care performed: No    MEDICATIONS ORDERED IN ED: Medications  glucagon   (human recombinant) (GLUCAGEN ) injection 1 mg (1 mg Intramuscular Given 06/30/24 2024)     IMPRESSION / MDM / ASSESSMENT AND PLAN / ED COURSE  I reviewed the triage vital signs and the nursing notes.                              Differential diagnosis includes, but is not limited to, food impaction, sensation of food impaction  Patient's presentation is most consistent with acute presentation with potential threat to life or bodily function.  Patient presented to the emergency department today because of concerns for possible food impaction of the esophagus.  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  Upon arrival patient was unable to swallow his saliva.  Was given glucagon .  He was then observed here in the emergency department.  He did get to a point where he was able to swallow water without difficulty.  At this time I do think likely the food impaction has improved.  I did offer to put the patient on an acids however the patient declined at this time.  Did encourage patient to follow-up with GI for repeat endoscopy. Discussed returning to the ED for any further episodes.       FINAL CLINICAL IMPRESSION(S) / ED DIAGNOSES   Final diagnoses:  Food impaction of esophagus, initial encounter      Note:  This document was prepared using Dragon voice recognition software and may include unintentional dictation errors.    Floy Roberts, MD 06/30/24 [phone]

## 2024-06-30 NOTE — ED Notes (Signed)
 Drinks provided per MD request. Pt drinking without complication. State no other needs at this time.
